# Patient Record
Sex: Male | Born: 1970 | Race: White | Hispanic: No | Smoking: Current every day smoker
Health system: Southern US, Community
[De-identification: ages and names within clinical notes are randomized; demographics above are authoritative.]

---

## 2003-08-31 HISTORY — PX: CHOLECYSTECTOMY: SHX55

## 2013-09-04 ENCOUNTER — Emergency Department: Payer: Self-pay | Admitting: Emergency Medicine

## 2013-09-04 LAB — COMPREHENSIVE METABOLIC PANEL
ALBUMIN: 4.5 g/dL (ref 3.4–5.0)
ALT: 35 U/L (ref 12–78)
ANION GAP: 4 — AB (ref 7–16)
Alkaline Phosphatase: 97 U/L
BILIRUBIN TOTAL: 0.6 mg/dL (ref 0.2–1.0)
BUN: 10 mg/dL (ref 7–18)
CHLORIDE: 104 mmol/L (ref 98–107)
Calcium, Total: 9 mg/dL (ref 8.5–10.1)
Co2: 27 mmol/L (ref 21–32)
Creatinine: 1.12 mg/dL (ref 0.60–1.30)
EGFR (African American): >60
EGFR (Non-African Amer.): >60
Glucose: 95 mg/dL (ref 65–99)
Osmolality: 269 (ref 275–301)
Potassium: 3.8 mmol/L (ref 3.5–5.1)
SGOT(AST): 25 U/L (ref 15–37)
SODIUM: 135 mmol/L — AB (ref 136–145)
Total Protein: 8.3 g/dL — ABNORMAL HIGH (ref 6.4–8.2)

## 2013-09-04 LAB — URINALYSIS, COMPLETE
Bacteria: NONE SEEN
Bilirubin,UR: NEGATIVE
Blood: NEGATIVE
Glucose,UR: NEGATIVE mg/dL (ref 0–75)
Ketone: NEGATIVE
Leukocyte Esterase: NEGATIVE
Nitrite: NEGATIVE
Ph: 7 (ref 4.5–8.0)
Protein: NEGATIVE
RBC,UR: NONE SEEN /HPF (ref 0–5)
Specific Gravity: 1.003 (ref 1.003–1.030)
WBC UR: NONE SEEN /HPF (ref 0–5)

## 2013-09-04 LAB — CBC
HCT: 46.9 % (ref 40.0–52.0)
HGB: 16.1 g/dL (ref 13.0–18.0)
MCH: 31.2 pg (ref 26.0–34.0)
MCHC: 34.4 g/dL (ref 32.0–36.0)
MCV: 91 fL (ref 80–100)
Platelet: 208 10*3/uL (ref 150–440)
RBC: 5.16 10*6/uL (ref 4.40–5.90)
RDW: 13.9 % (ref 11.5–14.5)
WBC: 7.2 10*3/uL (ref 3.8–10.6)

## 2018-04-05 ENCOUNTER — Emergency Department
Admission: EM | Admit: 2018-04-05 | Discharge: 2018-04-05 | Disposition: A | Discharge status: Home / Self Care | Payer: PRIVATE HEALTH INSURANCE | Attending: Emergency Medicine | Admitting: Emergency Medicine

## 2018-04-05 ENCOUNTER — Other Ambulatory Visit: Payer: Self-pay

## 2018-04-05 DIAGNOSIS — F17200 Nicotine dependence, unspecified, uncomplicated: Secondary | ICD-10-CM | POA: Insufficient documentation

## 2018-04-05 DIAGNOSIS — R1013 Epigastric pain: Secondary | ICD-10-CM | POA: Insufficient documentation

## 2018-04-05 DIAGNOSIS — R109 Unspecified abdominal pain: Secondary | ICD-10-CM | POA: Diagnosis present

## 2018-04-05 LAB — URINALYSIS, COMPLETE (UACMP) WITH MICROSCOPIC
Bacteria, UA: NONE SEEN
Bilirubin Urine: NEGATIVE
Glucose, UA: NEGATIVE mg/dL
Hgb urine dipstick: NEGATIVE
Ketones, ur: NEGATIVE mg/dL
Leukocytes, UA: NEGATIVE
Nitrite: NEGATIVE
Protein, ur: NEGATIVE mg/dL
Specific Gravity, Urine: 1.023 (ref 1.005–1.030)
Squamous Epithelial / LPF: NONE SEEN (ref 0–5)
pH: 5 (ref 5.0–8.0)

## 2018-04-05 LAB — LIPASE, BLOOD: Lipase: 27 U/L (ref 11–51)

## 2018-04-05 LAB — COMPREHENSIVE METABOLIC PANEL
ALT: 18 U/L (ref 0–44)
AST: 20 U/L (ref 15–41)
Albumin: 4.3 g/dL (ref 3.5–5.0)
Alkaline Phosphatase: 82 U/L (ref 38–126)
Anion gap: 7 (ref 5–15)
BUN: 12 mg/dL (ref 6–20)
CO2: 24 mmol/L (ref 22–32)
Calcium: 8.7 mg/dL — ABNORMAL LOW (ref 8.9–10.3)
Chloride: 109 mmol/L (ref 98–111)
Creatinine, Ser: 1.07 mg/dL (ref 0.61–1.24)
GFR calc Af Amer: >60 mL/min (ref >60)
GFR calc non Af Amer: >60 mL/min (ref >60)
Glucose, Bld: 135 mg/dL — ABNORMAL HIGH (ref 70–99)
Potassium: 4 mmol/L (ref 3.5–5.1)
Sodium: 140 mmol/L (ref 135–145)
Total Bilirubin: 0.6 mg/dL (ref 0.3–1.2)
Total Protein: 7.1 g/dL (ref 6.5–8.1)

## 2018-04-05 LAB — CBC
HCT: 42 % (ref 40.0–52.0)
Hemoglobin: 14.5 g/dL (ref 13.0–18.0)
MCH: 32.4 pg (ref 26.0–34.0)
MCHC: 34.4 g/dL (ref 32.0–36.0)
MCV: 94 fL (ref 80.0–100.0)
Platelets: 198 10*3/uL (ref 150–440)
RBC: 4.47 MIL/uL (ref 4.40–5.90)
RDW: 14.1 % (ref 11.5–14.5)
WBC: 8.2 10*3/uL (ref 3.8–10.6)

## 2018-04-05 MED ORDER — DICYCLOMINE HCL 10 MG PO CAPS
10.0000 mg | ORAL_CAPSULE | Freq: Once | ORAL | Status: AC
  Administered 2018-04-05: 10 mg via ORAL
  Filled 2018-04-05: qty 1

## 2018-04-05 MED ORDER — ONDANSETRON 8 MG PO TBDP: 8.0000 mg | ORAL_TABLET | Freq: Three times a day (TID) | ORAL | 0 refills | Status: AC | PRN

## 2018-04-05 MED ORDER — OMEPRAZOLE 40 MG PO CPDR: 40.0000 mg | DELAYED_RELEASE_CAPSULE | Freq: Every day | ORAL | 0 refills | Status: DC

## 2018-04-05 MED ORDER — DICYCLOMINE HCL 10 MG PO CAPS: 10.0000 mg | ORAL_CAPSULE | Freq: Three times a day (TID) | ORAL | 0 refills | Status: DC | PRN

## 2018-04-05 MED ORDER — ONDANSETRON HCL 4 MG/2ML IJ SOLN
4.0000 mg | Freq: Once | INTRAMUSCULAR | Status: AC
  Administered 2018-04-05: 4 mg via INTRAVENOUS
  Filled 2018-04-05: qty 2

## 2018-04-05 MED ORDER — FAMOTIDINE IN NACL 20-0.9 MG/50ML-% IV SOLN
20.0000 mg | Freq: Once | INTRAVENOUS | Status: AC
  Administered 2018-04-05: 20 mg via INTRAVENOUS
  Filled 2018-04-05: qty 50

## 2018-04-05 NOTE — ED Notes (Signed)
Pt presents to ED via POV with c/o abdominal cramping, N/V x 2 days. Pt states intermittent cramps that "feel like [his] gall bladder again", pt however endorses having a cholecystectomy in 2001. Pt states intermittent nausea, nothing worsens/betters the nausea, states sometimes eating helps. Pt states last BM earlier today.

## 2018-04-05 NOTE — ED Notes (Signed)
NAD noted at time of D/C. Pt denies questions or concerns. Pt ambulatory to the lobby at this time.  

## 2018-04-05 NOTE — ED Notes (Signed)
Medications administered per MD order. This RN explained that patient would be D/C upon completion of medication. Pt states understanding. Will continue to monitor for further patient needs.

## 2018-04-05 NOTE — ED Triage Notes (Signed)
Pt c/o intermittent generalized abd pain/cramping for the past 2 days with N/V.Dwayne Lozano Denies diarrhea.Dwayne Lozano

## 2018-04-05 NOTE — ED Provider Notes (Signed)
[facility] Emergency Department Provider Note ____________________________________________   First MD Initiated Contact with Patient 04/05/18 1709     (approximate)  I have reviewed the triage vital signs and the nursing notes.   HISTORY  Chief Complaint Abdominal Pain    HPI Dwayne Lozano. is a 47 y.o. male with PMH as noted below including cholecystectomy approximately 18 years ago who presents with upper abdominal pain over the last several days, intermittent, occurring a few times per day, and associated with nausea, burning in his chest, and a few episodes of vomiting.  Patient denies any change in his bowel movements, he denies fever.  He states he has had less severe bouts of this in the past.   History reviewed. No pertinent past medical history.  There are no active problems to display for this patient.   Past Surgical History:  Procedure Laterality Date  . CHOLECYSTECTOMY      Prior to Admission medications   Not on File    Allergies Patient has no known allergies.  No family history on file.  Social History Social History   Tobacco Use  . Smoking status: Current Every Day Smoker  . Smokeless tobacco: Never Used  Substance Use Topics  . Alcohol use: Not Currently  . Drug use: Not Currently    Review of Systems  Constitutional: No fever. Eyes: No redness. ENT: No sore throat. Cardiovascular: Denies chest pain. Respiratory: Denies shortness of breath. Gastrointestinal: Positive for vomiting.  Genitourinary: Negative for flank pain.  Musculoskeletal: Negative for back pain. Skin: Negative for rash. Neurological: Negative for headache.   ____________________________________________   PHYSICAL EXAM:  VITAL SIGNS: ED Triage Vitals  Enc Vitals Group     BP 04/05/18 1555 105/73     Pulse Rate 04/05/18 1555 86     Resp 04/05/18 1555 16     Temp 04/05/18 1555 98.4 F (36.9 C)     Temp Source 04/05/18 1555 Oral       SpO2 04/05/18 1555 98 %     Weight 04/05/18 1556 135 lb (61.2 kg)     Height 04/05/18 1556 5\' 6"  (1.676 m)     Head Circumference --      Peak Flow --      Pain Score 04/05/18 1556 3     Pain Loc --      Pain Edu? --      Excl. in [place]? --     Constitutional: Alert and oriented.  Relatively well appearing and in no acute distress. Eyes: Conjunctivae are normal.  No scleral icterus. Head: Atraumatic. Nose: No congestion/rhinnorhea. Mouth/Throat: Mucous membranes are moist.   Neck: Normal range of motion.  Cardiovascular: Normal rate, regular rhythm. Grossly normal heart sounds.  Good peripheral circulation. Respiratory: Normal respiratory effort.  No retractions. Lungs CTAB. Gastrointestinal: Soft and nontender. No distention.  Genitourinary: No flank tenderness. Musculoskeletal: Extremities warm and well perfused.  Neurologic:  Normal speech and language. No gross focal neurologic deficits are appreciated.  Skin:  Skin is warm and dry. No rash noted. Psychiatric: Mood and affect are normal. Speech and behavior are normal.  ____________________________________________   LABS (all labs ordered are listed, but only abnormal results are displayed)  Labs Reviewed  COMPREHENSIVE METABOLIC PANEL - Abnormal; Notable for the following components:      Result Value   Glucose, Bld 135 (*)    Calcium 8.7 (*)    All other components within normal limits  URINALYSIS, COMPLETE (  UACMP) WITH MICROSCOPIC - Abnormal; Notable for the following components:   Color, Urine YELLOW (*)    APPearance CLEAR (*)    All other components within normal limits  LIPASE, BLOOD  CBC   ____________________________________________  EKG   ____________________________________________  RADIOLOGY    ____________________________________________   PROCEDURES  Procedure(s) performed: No  Procedures  Critical Care performed: No ____________________________________________   INITIAL  IMPRESSION / ASSESSMENT AND PLAN / ED COURSE  Pertinent labs & imaging results that were available during my care of the patient were reviewed by me and considered in my medical decision making (see chart for details).  47 year old male with PMH as noted above and status post cholecystectomy presents with intermittent upper abdominal pain over the last several days associated with nausea and burning in his chest.  It is sometimes improved with eating.  No change in his bowel movements.  On exam, the patient is relatively well-appearing, his vital signs are normal, and the abdomen is soft with no focal tenderness.  He states that his symptoms have improved at this time.  Overall primary differential would be GERD versus gastritis.  Initial labs obtained from triage show no acute abnormalities, so I do not suspect pancreatitis or other hepatobiliary cause.  Given the negative work-up and resolved symptoms, the patient is stable for discharge home.  I counseled the patient on the results of the work-up and the likely diagnoses.  I will prescribe a PPI, and antispasmodic, and nausea medicine for home.  I will also give a referral to GI.  The patient agrees with this plan.  Return precautions given, and he expresses understanding.  ____________________________________________   FINAL CLINICAL IMPRESSION(S) / ED DIAGNOSES  Final diagnoses:  Epigastric pain      NEW MEDICATIONS STARTED DURING THIS VISIT:  New Prescriptions   No medications on file     Note:  This document was prepared using Dragon voice recognition software and may include unintentional dictation errors.    Arta Silence, MD 04/05/18 [phone]

## 2018-04-05 NOTE — Discharge Instructions (Addendum)
Take the acid blocking medication (omeprazole) as prescribed daily for the next several weeks, and the other occasions as needed for the symptoms.  Return to the ER for new, worsening, persistent severe pain, vomiting, fever, or any other new or worsening symptoms that concern you.  We have provided a referral to a gastroenterologist.

## 2018-06-13 ENCOUNTER — Encounter (INDEPENDENT_AMBULATORY_CARE_PROVIDER_SITE_OTHER): Payer: Self-pay

## 2018-06-13 ENCOUNTER — Other Ambulatory Visit: Payer: Self-pay

## 2018-06-13 ENCOUNTER — Encounter: Payer: Self-pay | Admitting: Gastroenterology

## 2018-06-13 ENCOUNTER — Ambulatory Visit (INDEPENDENT_AMBULATORY_CARE_PROVIDER_SITE_OTHER): Payer: PRIVATE HEALTH INSURANCE | Admitting: Gastroenterology

## 2018-06-13 VITALS — BP 111/66 | HR 71 | Ht 66.5 in | Wt 123.8 lb

## 2018-06-13 DIAGNOSIS — R109 Unspecified abdominal pain: Secondary | ICD-10-CM | POA: Diagnosis not present

## 2018-06-13 DIAGNOSIS — Z1211 Encounter for screening for malignant neoplasm of colon: Secondary | ICD-10-CM | POA: Diagnosis not present

## 2018-06-13 DIAGNOSIS — K219 Gastro-esophageal reflux disease without esophagitis: Secondary | ICD-10-CM

## 2018-06-13 DIAGNOSIS — K5900 Constipation, unspecified: Secondary | ICD-10-CM | POA: Diagnosis not present

## 2018-06-13 NOTE — Progress Notes (Signed)
Dwayne Lozano [address]  [place], [place] [address]  Main: [phone]  Fax: [phone]   Gastroenterology Consultation  Referring Provider:     Dr. Cherylann Banas Primary Care Physician:  Patient, No Pcp Per Primary Gastroenterologist:  Dr. Vonda Lozano Reason for Consultation:     Abdominal pain        HPI:    Chief Complaint  Patient presents with  . New Patient (Initial Visit)    ED f/u epigastric pain, abdominal pain (left lower side).    Dwayne Lozano. is a 47 y.o. y/o male referred for consultation & management  by Dr. Patient, No Pcp Per.  Patient went to the ER in August 2019 due to midepigastric abdominal pain and heartburn.  He has been prescribed omeprazole after that visit and his midepigastric pain has improved.  He was having symptoms 3-4 times a week of burning sensation in his chest and midepigastric pain, and is now only having 1-2 times a week.  No dysphagia.  Patient is otherwise a thin male, but denies any weight loss.  Reports eating regular meals without skipping.  No nausea or vomiting.  No prior EGD or colonoscopy.  No family history of colon cancer.  Also reports intermittent left lower quadrant abdominal pain intermittently, 3-4 times a week that improves after bowel movements, cramping, 1/10 in intensity, present since 2005.  Reports 1-2 soft bowel movements daily without straining.  Denies any diarrhea or constipation.  No hematochezia or melena.  Past medical history: Cholecystectomy  Past Surgical History:  Procedure Laterality Date  . CHOLECYSTECTOMY  2005    Prior to Admission medications   Not on File    Family History  Problem Relation Age of Onset  . Cancer Mother        cancer in back?, breast cancer  . Diabetes Maternal Grandmother   . Kidney disease Maternal Grandmother   . Cancer Maternal Aunt        breast cancer, gum cancer     Social History   Tobacco Use  . Smoking status: Current Every Day  Smoker  . Smokeless tobacco: Never Used  Substance Use Topics  . Alcohol use: Not Currently  . Drug use: Not Currently    Allergies as of 06/13/2018  . (No Known Allergies)    Review of Systems:    All systems reviewed and negative except where noted in HPI.   Physical Exam:  BP 111/66   Pulse 71   Ht 5' 6.5" (1.689 m)   Wt 123 lb 12.8 oz (56.2 kg)   BMI 19.68 kg/m  No LMP for male patient. Psych:  Alert and cooperative. Normal mood and affect. General:   Alert,  Well-developed, well-nourished, pleasant and cooperative in NAD Head:  Normocephalic and atraumatic. Eyes:  Sclera clear, no icterus.   Conjunctiva pink. Ears:  Normal auditory acuity. Nose:  No deformity, discharge, or lesions. Mouth:  No deformity or lesions,oropharynx pink & moist. Neck:  Supple; no masses or thyromegaly. Abdomen:  Normal bowel sounds.  No bruits.  Soft, non-tender and non-distended without masses, hepatosplenomegaly or hernias noted.  No guarding or rebound tenderness.    Msk:  Symmetrical without gross deformities. Good, equal movement & strength bilaterally. Pulses:  Normal pulses noted. Extremities:  No clubbing or edema.  No cyanosis. Neurologic:  Alert and oriented x3;  grossly normal neurologically. Skin:  Intact without significant lesions or rashes. No jaundice. Lymph Nodes:  No significant  cervical adenopathy. Psych:  Alert and cooperative. Normal mood and affect.   Labs: CBC    Component Value Date/Time   WBC 8.2 04/05/2018 1616   RBC 4.47 04/05/2018 1616   HGB 14.5 04/05/2018 1616   HGB 16.1 09/04/2013 2039   HCT 42.0 04/05/2018 1616   HCT 46.9 09/04/2013 2039   PLT 198 04/05/2018 1616   PLT 208 09/04/2013 2039   MCV 94.0 04/05/2018 1616   MCV 91 09/04/2013 2039   MCH 32.4 04/05/2018 1616   MCHC 34.4 04/05/2018 1616   RDW 14.1 04/05/2018 1616   RDW 13.9 09/04/2013 2039   CMP     Component Value Date/Time   NA 140 04/05/2018 1616   NA 135 (L) 09/04/2013 2039   K  4.0 04/05/2018 1616   K 3.8 09/04/2013 2039   CL 109 04/05/2018 1616   CL 104 09/04/2013 2039   CO2 24 04/05/2018 1616   CO2 27 09/04/2013 2039   GLUCOSE 135 (H) 04/05/2018 1616   GLUCOSE 95 09/04/2013 2039   BUN 12 04/05/2018 1616   BUN 10 09/04/2013 2039   CREATININE 1.07 04/05/2018 1616   CREATININE 1.12 09/04/2013 2039   CALCIUM 8.7 (L) 04/05/2018 1616   CALCIUM 9.0 09/04/2013 2039   PROT 7.1 04/05/2018 1616   PROT 8.3 (H) 09/04/2013 2039   ALBUMIN 4.3 04/05/2018 1616   ALBUMIN 4.5 09/04/2013 2039   AST 20 04/05/2018 1616   AST 25 09/04/2013 2039   ALT 18 04/05/2018 1616   ALT 35 09/04/2013 2039   ALKPHOS 82 04/05/2018 1616   ALKPHOS 97 09/04/2013 2039   BILITOT 0.6 04/05/2018 1616   BILITOT 0.6 09/04/2013 2039   GFRNONAA >60 04/05/2018 1616   GFRNONAA >60 09/04/2013 2039   GFRAA >60 04/05/2018 1616   GFRAA >60 09/04/2013 2039    Imaging Studies: CT abdomen in 2015, with large amount of stool in rectosigmoid  Assessment and Plan:   Daray Polgar. is a 47 y.o. y/o male has been referred for abdominal pain  Midepigastric abdominal pain and heartburn that he went to the ER with has improved with omeprazole once daily We discussed discontinuing the medication and changing it to Pepcid but patient does not want symptoms to return However, he is willing to undergo an EGD and if that does not show any alarming findings, he is willing to try to discontinue medication or try Pepcid.  (Risks of PPI use were discussed with patient including bone loss, C. Diff diarrhea, pneumonia, infections, CKD, electrolyte abnormalities.  If clinically possible based on symptoms, goal would be to maintain patient on the lowest dose possible, or discontinue the medication with institution of acid reflux lifestyle modifications over time. Pt. Verbalizes understanding and chooses to continue the medication.)  Patient educated extensively on acid reflux lifestyle modification, including buying a  bed wedge, not eating 3 hrs before bedtime, diet modifications, and handout given for the same.    His left lower quadrant abdominal pain is chronic, and it appears that there was CT done in 2015 for the same reason which showed constipation. His pain is likely related to underlying constipation as well Labs are reassuring No diarrhea, hematochezia, melena presents No alarm symptoms present High-fiber diet MiraLAX or Metamucil daily with goal of 1-2 soft bowel movements daily.  If not at goal, patient instructed to increase dose to twice daily.  If loose stools with the medication, patient asked to decrease the medication to every other day, or half  dose daily.  Patient verbalized understanding  He is due for a screening colonoscopy as per American Cancer Society guidelines and this was discussed He would like to proceed with colonoscopy along with EGD Colonoscopy would also allow [place] to evaluate for any underlying diverticulosis which would be a manifestation of constipation Patient is agreeable to proceeding with colonoscopy at this time  Patient was also asked to establish primary care as soon as possible and he verbalized understanding    Dr Dwayne Lozano  Speech recognition software was used to dictate the above note.

## 2018-06-15 NOTE — Addendum Note (Signed)
Addended by: Earl Lagos on: 06/15/2018 12:10 PM   Modules accepted: Orders, SmartSet

## 2018-07-06 ENCOUNTER — Encounter
Admission: RE | Disposition: A | Discharge status: Home / Self Care | Payer: Self-pay | Source: Ambulatory Visit | Attending: Gastroenterology

## 2018-07-06 ENCOUNTER — Ambulatory Visit
Admission: RE | Admit: 2018-07-06 | Discharge: 2018-07-06 | Disposition: A | Discharge status: Home / Self Care | Payer: Self-pay | Source: Ambulatory Visit | Attending: Gastroenterology | Admitting: Gastroenterology

## 2018-07-06 ENCOUNTER — Ambulatory Visit: Payer: Self-pay | Admitting: Anesthesiology

## 2018-07-06 ENCOUNTER — Encounter: Payer: Self-pay | Admitting: *Deleted

## 2018-07-06 DIAGNOSIS — D125 Benign neoplasm of sigmoid colon: Secondary | ICD-10-CM | POA: Insufficient documentation

## 2018-07-06 DIAGNOSIS — F17200 Nicotine dependence, unspecified, uncomplicated: Secondary | ICD-10-CM | POA: Insufficient documentation

## 2018-07-06 DIAGNOSIS — K3189 Other diseases of stomach and duodenum: Secondary | ICD-10-CM

## 2018-07-06 DIAGNOSIS — K621 Rectal polyp: Secondary | ICD-10-CM

## 2018-07-06 DIAGNOSIS — R109 Unspecified abdominal pain: Secondary | ICD-10-CM

## 2018-07-06 DIAGNOSIS — R1013 Epigastric pain: Secondary | ICD-10-CM

## 2018-07-06 DIAGNOSIS — Z1211 Encounter for screening for malignant neoplasm of colon: Secondary | ICD-10-CM

## 2018-07-06 DIAGNOSIS — K635 Polyp of colon: Secondary | ICD-10-CM

## 2018-07-06 HISTORY — PX: COLONOSCOPY WITH PROPOFOL: SHX5780

## 2018-07-06 HISTORY — PX: ESOPHAGOGASTRODUODENOSCOPY (EGD) WITH PROPOFOL: SHX5813

## 2018-07-06 SURGERY — COLONOSCOPY WITH PROPOFOL
Anesthesia: General

## 2018-07-06 MED ORDER — PROPOFOL 10 MG/ML IV BOLUS
INTRAVENOUS | Status: AC
  Filled 2018-07-06: qty 20

## 2018-07-06 MED ORDER — LIDOCAINE HCL (PF) 2 % IJ SOLN
INTRAMUSCULAR | Status: AC
  Filled 2018-07-06: qty 10

## 2018-07-06 MED ORDER — LIDOCAINE HCL (CARDIAC) PF 100 MG/5ML IV SOSY
PREFILLED_SYRINGE | INTRAVENOUS | Status: DC | PRN
  Administered 2018-07-06: 30 mg via INTRAVENOUS

## 2018-07-06 MED ORDER — PROPOFOL 500 MG/50ML IV EMUL
INTRAVENOUS | Status: DC | PRN
  Administered 2018-07-06: 120 ug/kg/min via INTRAVENOUS

## 2018-07-06 MED ORDER — SODIUM CHLORIDE 0.9 % IV SOLN
INTRAVENOUS | Status: DC
  Administered 2018-07-06: 08:00:00 via INTRAVENOUS

## 2018-07-06 MED ORDER — EPHEDRINE SULFATE 50 MG/ML IJ SOLN
INTRAMUSCULAR | Status: AC
  Filled 2018-07-06: qty 1

## 2018-07-06 MED ORDER — FENTANYL CITRATE (PF) 100 MCG/2ML IJ SOLN
INTRAMUSCULAR | Status: AC
  Filled 2018-07-06: qty 2

## 2018-07-06 MED ORDER — MIDAZOLAM HCL 2 MG/2ML IJ SOLN
INTRAMUSCULAR | Status: AC
  Filled 2018-07-06: qty 2

## 2018-07-06 MED ORDER — MIDAZOLAM HCL 2 MG/2ML IJ SOLN
INTRAMUSCULAR | Status: DC | PRN
  Administered 2018-07-06: 2 mg via INTRAVENOUS

## 2018-07-06 MED ORDER — EPHEDRINE SULFATE 50 MG/ML IJ SOLN
INTRAMUSCULAR | Status: DC | PRN
  Administered 2018-07-06: 5 mg via INTRAVENOUS

## 2018-07-06 MED ORDER — PROPOFOL 500 MG/50ML IV EMUL
INTRAVENOUS | Status: AC
  Filled 2018-07-06: qty 50

## 2018-07-06 MED ORDER — FENTANYL CITRATE (PF) 100 MCG/2ML IJ SOLN
INTRAMUSCULAR | Status: DC | PRN
  Administered 2018-07-06: 50 ug via INTRAVENOUS

## 2018-07-06 NOTE — Anesthesia Preprocedure Evaluation (Addendum)
Anesthesia Evaluation  Patient identified by MRN, date of birth, ID band Patient awake    Reviewed: Allergy & Precautions, H&P , NPO status , Patient's Chart, lab work & pertinent test results  Airway Mallampati: II       Dental  (+) Edentulous Lower, Edentulous Upper   Pulmonary neg pulmonary ROS, Current Smoker,           Cardiovascular (-) angina(-) Past MI negative cardio ROS   - Diastolic murmurs    Neuro/Psych negative neurological ROS  negative psych ROS   GI/Hepatic negative GI ROS, Neg liver ROS,   Endo/Other  negative endocrine ROS  Renal/GU negative Renal ROS  negative genitourinary   Musculoskeletal   Abdominal   Peds  Hematology negative hematology ROS (+)   Anesthesia Other Findings History reviewed. No pertinent past medical history.  Past Surgical History: 2005: CHOLECYSTECTOMY  BMI    Body Mass Index:  19.68 kg/m      Reproductive/Obstetrics negative OB ROS                            Anesthesia Physical Anesthesia Plan  ASA: II  Anesthesia Plan: General   Post-op Pain Management:    Induction:   PONV Risk Score and Plan: Propofol infusion and TIVA  Airway Management Planned: Natural Airway and Nasal Cannula  Additional Equipment:   Intra-op Plan:   Post-operative Plan:   Informed Consent: I have reviewed the patients History and Physical, chart, labs and discussed the procedure including the risks, benefits and alternatives for the proposed anesthesia with the patient or authorized representative who has indicated his/her understanding and acceptance.   Dental Advisory Given  Plan Discussed with: Anesthesiologist, CRNA and Surgeon  Anesthesia Plan Comments:        Anesthesia Quick Evaluation

## 2018-07-06 NOTE — Anesthesia Postprocedure Evaluation (Signed)
Anesthesia Post Note  Patient: Dwayne Lozano.  Procedure(s) Performed: COLONOSCOPY WITH PROPOFOL (N/A ) ESOPHAGOGASTRODUODENOSCOPY (EGD) WITH PROPOFOL (N/A )  Patient location during evaluation: PACU Anesthesia Type: General Level of consciousness: awake and alert Pain management: pain level controlled Vital Signs Assessment: post-procedure vital signs reviewed and stable Respiratory status: spontaneous breathing, nonlabored ventilation and respiratory function stable Cardiovascular status: blood pressure returned to baseline and stable Postop Assessment: no apparent nausea or vomiting Anesthetic complications: no     Last Vitals:  Vitals:   07/06/18 0928 07/06/18 0938  BP: 127/83 113/86  Pulse: 66 60  Resp: 12 11  Temp:    SpO2: 99% 100%    Last Pain:  Vitals:   07/06/18 0938  TempSrc:   PainSc: 0-No pain                 Durenda Hurt

## 2018-07-06 NOTE — Op Note (Signed)
[facility] Gastroenterology Patient Name: Affan Callow Procedure Date: 07/06/2018 7:21 AM MRN: [id number] Account #: [id number] Date of Birth: 1971/03/17 Admit Type: Outpatient Age: 47 Room: [facility] ENDO ROOM 2 Gender: Male Note Status: Finalized Procedure:            Colonoscopy Indications:          Screening for colorectal malignant neoplasm Providers:            Khyler Urda B. Bonna Gains MD, MD Medicines:            Monitored Anesthesia Care Complications:        No immediate complications. Procedure:            Pre-Anesthesia Assessment:                       - ASA Grade Assessment: II - A patient with mild                        systemic disease.                       - Prior to the procedure, a History and Physical was                        performed, and patient medications, allergies and                        sensitivities were reviewed. The patient's tolerance of                        previous anesthesia was reviewed.                       - The risks and benefits of the procedure and the                        sedation options and risks were discussed with the                        patient. All questions were answered and informed                        consent was obtained.                       - Patient identification and proposed procedure were                        verified prior to the procedure by the physician, the                        nurse, the anesthesiologist, the anesthetist and the                        technician. The procedure was verified in the procedure                        room.                       After obtaining informed consent, the colonoscope was  passed under direct vision. Throughout the procedure,                        the patient's blood pressure, pulse, and oxygen                        saturations were monitored continuously. The                        Colonoscope was introduced through the anus  and                        advanced to the the cecum, identified by appendiceal                        orifice and ileocecal valve. The colonoscopy was                        performed with ease. The patient tolerated the                        procedure well. The quality of the bowel preparation                        was fair. Water and suctioning was used to clean the                        colon. Findings:      The perianal and digital rectal examinations were normal.      Two sessile polyps were found in the sigmoid colon. The polyps were 4 to       6 mm in size. These polyps were removed with a cold snare. Resection and       retrieval were complete.      A 3 mm polyp was found in the rectum. The polyp was sessile. The polyp       was removed with a cold biopsy forceps. Resection and retrieval were       complete.      The exam was otherwise without abnormality.      The rectum, sigmoid colon, descending colon, transverse colon, ascending       colon and cecum appeared normal.      The retroflexed view of the distal rectum and anal verge was normal and       showed no anal or rectal abnormalities. Impression:           - Preparation of the colon was fair.                       - Two 4 to 6 mm polyps in the sigmoid colon, removed                        with a cold snare. Resected and retrieved.                       - One 3 mm polyp in the rectum, removed with a cold                        biopsy forceps. Resected and retrieved.                       -  The examination was otherwise normal.                       - The rectum, sigmoid colon, descending colon,                        transverse colon, ascending colon and cecum are normal.                       - The distal rectum and anal verge are normal on                        retroflexion view. Recommendation:       - Discharge patient to home (with escort).                       - Advance diet as tolerated.                        - Continue present medications.                       - Await pathology results.                       - Repeat colonoscopy in 3 years for surveillance (with                        2 day prep).                       - The findings and recommendations were discussed with                        the patient.                       - The findings and recommendations were discussed with                        the patient's family.                       - Return to primary care physician as previously                        scheduled. Procedure Code(s):    --- Professional ---                       [phone], Colonoscopy, flexible; with removal of tumor(s),                        polyp(s), or other lesion(s) by snare technique                       45380, 51, Colonoscopy, flexible; with biopsy, single                        or multiple Diagnosis Code(s):    --- Professional ---                       Z12.11, Encounter for screening for malignant neoplasm  of colon                       D12.5, Benign neoplasm of sigmoid colon                       K62.1, Rectal polyp CPT copyright 2018 American Medical Association. All rights reserved. The codes documented in this report are preliminary and upon coder review may  be revised to meet current compliance requirements.  Vonda Antigua, MD Margretta Sidle B. Bonna Gains MD, MD 07/06/2018 9:09:40 AM This report has been signed electronically. Number of Addenda: 0 Note Initiated On: 07/06/2018 7:21 AM Scope Withdrawal Time: 0 hours 27 minutes 19 seconds  Total Procedure Duration: 0 hours 34 minutes 10 seconds  Estimated Blood Loss: Estimated blood loss: none.      [facility]

## 2018-07-06 NOTE — Anesthesia Procedure Notes (Signed)
Performed by: Vaughan Sine Pre-anesthesia Checklist: Patient identified, Suction available, Emergency Drugs available, Patient being monitored and Timeout performed Patient Re-evaluated:Patient Re-evaluated prior to induction Oxygen Delivery Method: Nasal cannula Preoxygenation: Pre-oxygenation with 100% oxygen Induction Type: IV induction Airway Equipment and Method: Bite block Placement Confirmation: positive ETCO2 and CO2 detector

## 2018-07-06 NOTE — H&P (Signed)
Dwayne Antigua, MD [address] [place], [place], [place], [address] [id number], [place], [place], [place], [address] Phone: [phone]  Fax: [phone]  Primary Care Physician:  Patient, No Pcp Per   Pre-Procedure History & Physical: HPI:  Dwayne Lozano. is a 47 y.o. male is here for a colonoscopy and EGD.   History reviewed. No pertinent past medical history.  Past Surgical History:  Procedure Laterality Date  . CHOLECYSTECTOMY  2005    Prior to Admission medications   Not on File    Allergies as of 06/15/2018  . (No Known Allergies)    Family History  Problem Relation Age of Onset  . Cancer Mother        cancer in back?, breast cancer  . Diabetes Maternal Grandmother   . Kidney disease Maternal Grandmother   . Cancer Maternal Aunt        breast cancer, gum cancer    Social History   Socioeconomic History  . Marital status: Divorced    Spouse name: Not on file  . Number of children: Not on file  . Years of education: Not on file  . Highest education level: Not on file  Occupational History  . Not on file  Social Needs  . Financial resource strain: Not on file  . Food insecurity:    Worry: Not on file    Inability: Not on file  . Transportation needs:    Medical: Not on file    Non-medical: Not on file  Tobacco Use  . Smoking status: Current Every Day Smoker  . Smokeless tobacco: Never Used  Substance and Sexual Activity  . Alcohol use: Not Currently  . Drug use: Not Currently  . Sexual activity: Not on file  Lifestyle  . Physical activity:    Days per week: Not on file    Minutes per session: Not on file  . Stress: Not on file  Relationships  . Social connections:    Talks on phone: Not on file    Gets together: Not on file    Attends religious service: Not on file    Active member of club or organization: Not on file    Attends meetings of clubs or organizations: Not on file    Relationship status: Not on file  . Intimate  partner violence:    Fear of current or ex partner: Not on file    Emotionally abused: Not on file    Physically abused: Not on file    Forced sexual activity: Not on file  Other Topics Concern  . Not on file  Social History Narrative  . Not on file    Review of Systems: See HPI, otherwise negative ROS  Physical Exam: BP 131/74   Pulse 68   Temp 97.7 F (36.5 C) (Tympanic)   Resp 16   Wt 56.2 kg   SpO2 100%   BMI 19.68 kg/m  General:   Alert,  pleasant and cooperative in NAD Head:  Normocephalic and atraumatic. Neck:  Supple; no masses or thyromegaly. Lungs:  Clear throughout to auscultation, normal respiratory effort.    Heart:  +S1, +S2, Regular rate and rhythm, No edema. Abdomen:  Soft, nontender and nondistended. Normal bowel sounds, without guarding, and without rebound.   Neurologic:  Alert and  oriented x4;  grossly normal neurologically.  Impression/Plan: Dwayne Lozano. is here for a colonoscopy to be performed for average risk screening and EGD for Acid Reflux, abdominal pain.  Risks, benefits, limitations, and alternatives regarding the procedures have been reviewed with the patient.  Questions have been answered.  All parties agreeable.   Virgel Manifold, MD  07/06/2018, 8:08 AM

## 2018-07-06 NOTE — Op Note (Signed)
[facility] Gastroenterology Patient Name: Dwayne Lozano Procedure Date: 07/06/2018 7:23 AM MRN: [id number] Account #: [id number] Date of Birth: 28-Feb-1971 Admit Type: Outpatient Age: 47 Room: [facility] ENDO ROOM 2 Gender: Male Note Status: Finalized Procedure:            Upper GI endoscopy Indications:          Epigastric abdominal pain Providers:            Coree Riester B. Bonna Gains MD, MD Medicines:            Monitored Anesthesia Care Complications:        No immediate complications. Procedure:            Pre-Anesthesia Assessment:                       - Prior to the procedure, a History and Physical was                        performed, and patient medications, allergies and                        sensitivities were reviewed. The patient's tolerance of                        previous anesthesia was reviewed.                       - The risks and benefits of the procedure and the                        sedation options and risks were discussed with the                        patient. All questions were answered and informed                        consent was obtained.                       - Patient identification and proposed procedure were                        verified prior to the procedure by the physician, the                        nurse, the anesthesiologist, the anesthetist and the                        technician. The procedure was verified in the procedure                        room.                       - ASA Grade Assessment: II - A patient with mild                        systemic disease.                       After obtaining informed consent, the endoscope was  passed under direct vision. Throughout the procedure,                        the patient's blood pressure, pulse, and oxygen                        saturations were monitored continuously. The Endoscope                        was introduced through the mouth, and advanced  to the                        second part of duodenum. The upper GI endoscopy was                        accomplished with ease. The patient tolerated the                        procedure well. Findings:      The Z-line was irregular. As per guidelines salmon colored mucosa above       1 cm should be biopsied. This was      not the case here, and the Z-line was just irregular and salmon colored       mucosa was      not above 1 cm in length.      The examined esophagus was normal.      Patchy mildly erythematous mucosa without bleeding was found in the       gastric antrum. Biopsies were taken with a cold forceps for histology.       Biopsies were obtained in the gastric body, at the incisura and in the       gastric antrum with cold forceps for histology.      A few, 2 to 4 mm non-bleeding erosions were found in the gastric fundus       and in the gastric antrum. There were no stigmata of recent bleeding.       Biopsies were taken with a cold forceps for histology.      The duodenal bulb, second portion of the duodenum and examined duodenum       were normal. Impression:           - Z-line irregular.                       - Normal esophagus.                       - Erythematous mucosa in the antrum. Biopsied.                       - Non-bleeding erosive gastropathy. Biopsied.                       - Normal duodenal bulb, second portion of the duodenum                        and examined duodenum.                       - Biopsies were obtained in the gastric body, at the  incisura and in the gastric antrum. Recommendation:       - Await pathology results.                       - Discharge patient to home (with escort).                       - Advance diet as tolerated.                       - Continue present medications.                       - Patient has a contact number available for                        emergencies. The signs and symptoms of potential                         delayed complications were discussed with the patient.                        Return to normal activities tomorrow. Written discharge                        instructions were provided to the patient.                       - Discharge patient to home (with escort).                       - The findings and recommendations were discussed with                        the patient.                       - The findings and recommendations were discussed with                        the patient's family.                       - Avoid NSAIDs except Aspirin if medically indicated Procedure Code(s):    --- Professional ---                       [id number], Esophagogastroduodenoscopy, flexible, transoral;                        with biopsy, single or multiple Diagnosis Code(s):    --- Professional ---                       K22.8, Other specified diseases of esophagus                       K31.89, Other diseases of stomach and duodenum                       R10.13, Epigastric pain CPT copyright 2018 American Medical Association. All rights reserved. The codes documented in this report are preliminary and upon coder review may  be revised to meet current compliance  requirements.  Vonda Antigua, MD Margretta Sidle B. Bonna Gains MD, MD 07/06/2018 8:25:09 AM This report has been signed electronically. Number of Addenda: 0 Note Initiated On: 07/06/2018 7:23 AM Estimated Blood Loss: Estimated blood loss: none.      [facility]

## 2018-07-06 NOTE — Transfer of Care (Signed)
Immediate Anesthesia Transfer of Care Note  Patient: Dwayne Lozano.  Procedure(s) Performed: COLONOSCOPY WITH PROPOFOL (N/A ) ESOPHAGOGASTRODUODENOSCOPY (EGD) WITH PROPOFOL (N/A )  Patient Location: PACU  Anesthesia Type:General  Level of Consciousness: awake and sedated  Airway & Oxygen Therapy: Patient Spontanous Breathing and Patient connected to nasal cannula oxygen  Post-op Assessment: Report given to RN and Post -op Vital signs reviewed and stable  Post vital signs: Reviewed and stable  Last Vitals:  Vitals Value Taken Time  BP    Temp    Pulse    Resp    SpO2      Last Pain:  Vitals:   07/06/18 0730  TempSrc: Tympanic         Complications: No apparent anesthesia complications

## 2018-07-06 NOTE — Anesthesia Post-op Follow-up Note (Signed)
Anesthesia QCDR form completed.        

## 2018-07-07 LAB — SURGICAL PATHOLOGY

## 2018-07-10 ENCOUNTER — Encounter: Payer: Self-pay | Admitting: Gastroenterology

## 2018-07-20 ENCOUNTER — Encounter: Payer: Self-pay | Admitting: Gastroenterology

## 2018-07-24 ENCOUNTER — Telehealth: Payer: Self-pay

## 2018-07-24 NOTE — Telephone Encounter (Signed)
LMTCO.

## 2018-07-24 NOTE — Telephone Encounter (Signed)
-----   Message from Virgel Manifold, MD sent at 07/19/2018  1:15 PM EST ----- Jackelyn Poling please let patient know, his polyps were benign but precancerous.  Repeat colonoscopy recommended in 3 years please set recall. Please let him know his stomach biopsies did not show any bacteria.  Mild changes were present in the tissue of his stomach that were sent to the second pathologist for review and his review did not reveal any signs of concern. Due to the small possibility of these changes, called metaplasia, I would recommend repeat EGD in 1 year for repeat biopsies.  This is not something that would be symptomatic. Since there were no ulcers present I would recommend that he discontinue his PPI.  If he still having symptoms I would recommend him taking Pepcid once daily instead of the PPI. He should establish a primary care provider and can follow-up with [place] in clinic with any questions.

## 2018-07-25 NOTE — Telephone Encounter (Signed)
PT LEFT VM TO SPEAK WITH DEBBIE RETURNING HER CALL FROM YESTERDAY

## 2018-08-02 ENCOUNTER — Telehealth: Payer: Self-pay | Admitting: Gastroenterology

## 2018-08-02 NOTE — Telephone Encounter (Signed)
Pt says he missed a call from [place]. Waiting for results for 3 weeks. Pls call patient

## 2018-08-02 NOTE — Telephone Encounter (Signed)
I spoke with pt today, see telephone encounter.

## 2018-08-02 NOTE — Telephone Encounter (Signed)
Pt informed of results and that recall letters will be sent for 1 yr repeat EGD and 3 yr colonoscopy. Pt not taking any PPI and I will mail pt letter with name of Pepcid per pt request. Also pt c/o sore throat and coughing at times (dry) since EGD. No fever, no spitting of blood, able to eat and drink with no problem. Possible post nasal drip or staying irritated due to smoking pt has not taken any tylenol for pain. I informed pt that if this does continue or worsens to contact office or go to an Urgent care or ED. Has not gotten a  PCP yet due to he has not received insurance card.

## 2019-07-12 ENCOUNTER — Other Ambulatory Visit: Payer: Self-pay

## 2019-07-12 ENCOUNTER — Emergency Department: Payer: Medicaid Other

## 2019-07-12 ENCOUNTER — Emergency Department
Admission: EM | Admit: 2019-07-12 | Discharge: 2019-07-12 | Disposition: A | Discharge status: Home / Self Care | Payer: Medicaid Other | Attending: Emergency Medicine | Admitting: Emergency Medicine

## 2019-07-12 ENCOUNTER — Encounter: Payer: Self-pay | Admitting: Emergency Medicine

## 2019-07-12 DIAGNOSIS — S82092B Other fracture of left patella, initial encounter for open fracture type I or II: Secondary | ICD-10-CM | POA: Insufficient documentation

## 2019-07-12 DIAGNOSIS — S81012A Laceration without foreign body, left knee, initial encounter: Secondary | ICD-10-CM

## 2019-07-12 DIAGNOSIS — Y939 Activity, unspecified: Secondary | ICD-10-CM | POA: Insufficient documentation

## 2019-07-12 DIAGNOSIS — F17200 Nicotine dependence, unspecified, uncomplicated: Secondary | ICD-10-CM | POA: Insufficient documentation

## 2019-07-12 DIAGNOSIS — Y999 Unspecified external cause status: Secondary | ICD-10-CM | POA: Insufficient documentation

## 2019-07-12 DIAGNOSIS — W228XXA Striking against or struck by other objects, initial encounter: Secondary | ICD-10-CM | POA: Insufficient documentation

## 2019-07-12 DIAGNOSIS — Z23 Encounter for immunization: Secondary | ICD-10-CM | POA: Insufficient documentation

## 2019-07-12 DIAGNOSIS — Y929 Unspecified place or not applicable: Secondary | ICD-10-CM | POA: Insufficient documentation

## 2019-07-12 MED ORDER — SULFAMETHOXAZOLE-TRIMETHOPRIM 800-160 MG PO TABS: 1.0000 | ORAL_TABLET | Freq: Two times a day (BID) | ORAL | 0 refills | Status: DC

## 2019-07-12 MED ORDER — TRAMADOL HCL 50 MG PO TABS: 50.0000 mg | ORAL_TABLET | Freq: Once | ORAL | Status: DC

## 2019-07-12 MED ORDER — TETANUS-DIPHTH-ACELL PERTUSSIS 5-2.5-18.5 LF-MCG/0.5 IM SUSP
0.5000 mL | Freq: Once | INTRAMUSCULAR | Status: DC
  Filled 2019-07-12: qty 0.5

## 2019-07-12 MED ORDER — NAPROXEN 500 MG PO TABS
500.0000 mg | ORAL_TABLET | Freq: Once | ORAL | Status: AC
  Administered 2019-07-12: 500 mg via ORAL
  Filled 2019-07-12: qty 1

## 2019-07-12 MED ORDER — METOCLOPRAMIDE HCL 10 MG PO TABS: 10.0000 mg | ORAL_TABLET | Freq: Once | ORAL | Status: DC

## 2019-07-12 MED ORDER — TETANUS-DIPHTH-ACELL PERTUSSIS 5-2.5-18.5 LF-MCG/0.5 IM SUSP
0.5000 mL | Freq: Once | INTRAMUSCULAR | Status: AC
  Administered 2019-07-12: 0.5 mL via INTRAMUSCULAR

## 2019-07-12 MED ORDER — SULFAMETHOXAZOLE-TRIMETHOPRIM 800-160 MG PO TABS
1.0000 | ORAL_TABLET | Freq: Once | ORAL | Status: AC
  Administered 2019-07-12: 1 via ORAL
  Filled 2019-07-12: qty 1

## 2019-07-12 MED ORDER — HYDROCODONE-ACETAMINOPHEN 5-325 MG PO TABS: 1.0000 | ORAL_TABLET | Freq: Three times a day (TID) | ORAL | 0 refills | Status: AC | PRN

## 2019-07-12 MED ORDER — LIDOCAINE HCL (PF) 1 % IJ SOLN
5.0000 mL | Freq: Once | INTRAMUSCULAR | Status: AC
  Administered 2019-07-12: 5 mL
  Filled 2019-07-12: qty 5

## 2019-07-12 NOTE — ED Notes (Signed)
EDP in room sewing pt's knee. Pt tolerating well.

## 2019-07-12 NOTE — ED Triage Notes (Signed)
Says gash to left knee this am.

## 2019-07-12 NOTE — ED Provider Notes (Signed)
[facility] Emergency Department Provider Note ____________________________________________  Time seen: 1429  I have reviewed the triage vital signs and the nursing notes.  HISTORY  Chief Complaint  Laceration  HPI Dwayne Lozano. is a 48 y.o. male presents to the ED for evaluation of an accidental laceration to the left knee. He reports his knee being hit by the spinning belt on the lawnmower he was repairing, causing a laceration to the left knee. He is denying any other injury at this time. He is unclear of his tetanus status. He reports pain to the knee and leg with movement.  History reviewed. No pertinent past medical history.  Patient Active Problem List   Diagnosis Date Noted  . Special screening for malignant neoplasms, colon   . Polyp of sigmoid colon   . Rectal polyp   . Abdominal pain, epigastric   . Stomach irritation     Past Surgical History:  Procedure Laterality Date  . CHOLECYSTECTOMY  2005  . COLONOSCOPY WITH PROPOFOL N/A 07/06/2018   Procedure: COLONOSCOPY WITH PROPOFOL;  Surgeon: Virgel Manifold, MD;  Location: [facility] ENDOSCOPY;  Service: Endoscopy;  Laterality: N/A;  . ESOPHAGOGASTRODUODENOSCOPY (EGD) WITH PROPOFOL N/A 07/06/2018   Procedure: ESOPHAGOGASTRODUODENOSCOPY (EGD) WITH PROPOFOL;  Surgeon: Virgel Manifold, MD;  Location: [facility] ENDOSCOPY;  Service: Endoscopy;  Laterality: N/A;    Prior to Admission medications   Medication Sig Start Date End Date Taking? Authorizing Provider  HYDROcodone-acetaminophen (NORCO) 5-325 MG tablet Take 1 tablet by mouth 3 (three) times daily as needed for up to 3 days. 07/12/19 07/15/19  Donette Mainwaring, Dannielle Karvonen, PA-C  sulfamethoxazole-trimethoprim (BACTRIM DS) 800-160 MG tablet Take 1 tablet by mouth 2 (two) times daily. 07/12/19   Kalliope Riesen, Dannielle Karvonen, PA-C    Allergies Patient has no known allergies.  Family History  Problem Relation Age of Onset  . Cancer Mother        cancer in  back?, breast cancer  . Diabetes Maternal Grandmother   . Kidney disease Maternal Grandmother   . Cancer Maternal Aunt        breast cancer, gum cancer    Social History Social History   Tobacco Use  . Smoking status: Current Every Day Smoker  . Smokeless tobacco: Never Used  Substance Use Topics  . Alcohol use: Not Currently  . Drug use: Not Currently    Review of Systems  Constitutional: Negative for fever. Cardiovascular: Negative for chest pain. Respiratory: Negative for shortness of breath. Musculoskeletal: Negative for back pain. Skin: Negative for rash. Left knee laceration.  Neurological: Negative for headaches, focal weakness or numbness. ____________________________________________  PHYSICAL EXAM:  VITAL SIGNS: ED Triage Vitals  Enc Vitals Group     BP 07/12/19 1355 132/77     Pulse Rate 07/12/19 1355 82     Resp 07/12/19 1355 16     Temp 07/12/19 1355 98.1 F (36.7 C)     Temp Source 07/12/19 1355 Oral     SpO2 07/12/19 1355 97 %     Weight 07/12/19 1356 135 lb (61.2 kg)     Height 07/12/19 1356 5' 6.5" (1.689 m)     Head Circumference --      Peak Flow --      Pain Score 07/12/19 1416 5     Pain Loc --      Pain Edu? --      Excl. in [place]? --     Constitutional: Alert and oriented. Well appearing  and in no distress. Head: Normocephalic and atraumatic. Eyes: Conjunctivae are normal. Normal extraocular movements Cardiovascular: Normal rate, regular rhythm. Normal distal pulses. Respiratory: Normal respiratory effort.  Musculoskeletal: left knee without obvious deformity, dislocation, or joint effusion.  Patient with a 3 cm laceration over the medial left knee.  The laceration extends into the subcutaneous tissues.  Patient with a normal knee exam with normal patella tracking without laxity or ballottement.  No internal derangement is suspected.  Nontender with normal range of motion in all extremities.  Neurologic: Antalgic gait without ataxia. Normal  speech and language. No gross focal neurologic deficits are appreciated. Skin:  Skin is warm, dry and intact. No rash noted. ___________________________________________   RADIOLOGY  DG Left Knee IMPRESSION: 1. Laceration inferior to the patella. Injury to the patellar tendon is not excluded on this study but the patella is not high riding suggesting there is not a full-thickness injury. 2. Contour abnormality along the anterior aspect of the patella suggests a tiny fracture from the patient's injury. 3. High attenuation foci in the soft tissues anterior to contour abnormality of the anterior patella may represent tiny bony fragments or foreign bodies. 4. High attenuation foci along the laceration inferior to the patella suggest foreign bodies.  I, Melvenia Needles, personally viewed and evaluated these images (plain radiographs) as part of my medical decision making, as well as reviewing the written report by the radiologist. ____________________________________________  PROCEDURES  Tdap 0.5 ml IM Naproxen 500 mg PO Bactrim DS 1 PO Ultram 50 mg PO Wound dressing Knee immobilizer  .Marland KitchenLaceration Repair  Date/Time: 07/12/2019 2:45 PM Performed by: Norva Karvonen, Student-PA Authorized by: Melvenia Needles, PA-C   Consent:    Consent obtained:  Verbal   Consent given by:  Patient   Risks discussed:  Pain, infection and poor wound healing   Alternatives discussed:  Delayed treatment Anesthesia (see MAR for exact dosages):    Anesthesia method:  Local infiltration   Local anesthetic:  Lidocaine 1% w/o epi Laceration details:    Location:  Leg   Leg location:  L lower leg   Length (cm):  3   Depth (mm):  3 Repair type:    Repair type:  Intermediate Pre-procedure details:    Preparation:  Patient was prepped and draped in usual sterile fashion Treatment:    Area cleansed with:  Betadine and saline   Amount of cleaning:  Standard Subcutaneous repair:     Suture size:  4-0   Suture material:  Vicryl   Suture technique:  Running   Number of sutures:  1 Skin repair:    Repair method:  Sutures   Suture size:  3-0   Suture material:  Nylon   Suture technique:  Simple interrupted   Number of sutures:  5 Approximation:    Approximation:  Close Post-procedure details:    Dressing:  Non-adherent dressing, bulky dressing and splint for protection   Patient tolerance of procedure:  Tolerated well, no immediate complications  ____________________________________________  INITIAL IMPRESSION / ASSESSMENT AND PLAN / ED COURSE  Patient with ED evaluation of acute left knee pain a laceration.  He sustained a contusion to the knee, which resulted in a cortical defect to the patella.  The overlying laceration is repaired using sterile technique and good wound edge approximation is achieved.  He is treated empirically for open fracture and prophylax with Bactrim in the ED.  A prescription for the same was sent to the pharmacy as  well as hydrocodone for acute pain relief.  He is encouraged to keep the wound clean, dry, and covered.  A knee immobilizer is provided for comfort and support.  He will rest, elevate, and ice the knee as needed for pain and inflammation.  He is to return to a local urgent care in 10 to 14 days for suture removal.  He may return to the ED as needed for interim wound check or worsening signs of infection as discussed.  Dwayne Lozano. was evaluated in Emergency Department on 07/12/2019 for the symptoms described in the history of present illness. He was evaluated in the context of the global COVID-19 pandemic, which necessitated consideration that the patient might be at risk for infection with the SARS-CoV-2 virus that causes COVID-19. Institutional protocols and algorithms that pertain to the evaluation of patients at risk for COVID-19 are in a state of rapid change based on information released by regulatory bodies including the CDC and  federal and state organizations. These policies and algorithms were followed during the patient's care in the ED. ____________________________________________  FINAL CLINICAL IMPRESSION(S) / ED DIAGNOSES  Final diagnoses:  Type I or II open sleeve fracture of left patella, initial encounter  Knee laceration, left, initial encounter      Melvenia Needles, PA-C 07/12/19 1743    Delman Kitten, MD 07/13/19 1744

## 2019-07-12 NOTE — Discharge Instructions (Addendum)
You have a small chip fracture to the knee cap from your injury. Because of the laceration over the bony injury, you will be placed on antibiotics. Take the antibiotic as directed and the pain medicine as needed. Keep the wound clean, dry, and covered. Wash only with soap & water. Follow-up with Dr. Sabra Heck for further knee injury. Rest with the knee elevated and apply ice to reduce pain and swelling. You should see a local urgent care or this ED for suture removal in 10-14 days.

## 2020-07-12 IMAGING — DX DG KNEE 1-2V*L*
1 series · 1 of 1 positions shown · non-contrast
Comparison: None

CLINICAL DATA: Lumbar accident.  Laceration and pain.

EXAM:
LEFT KNEE - 1-2 VIEW

[knee ap]
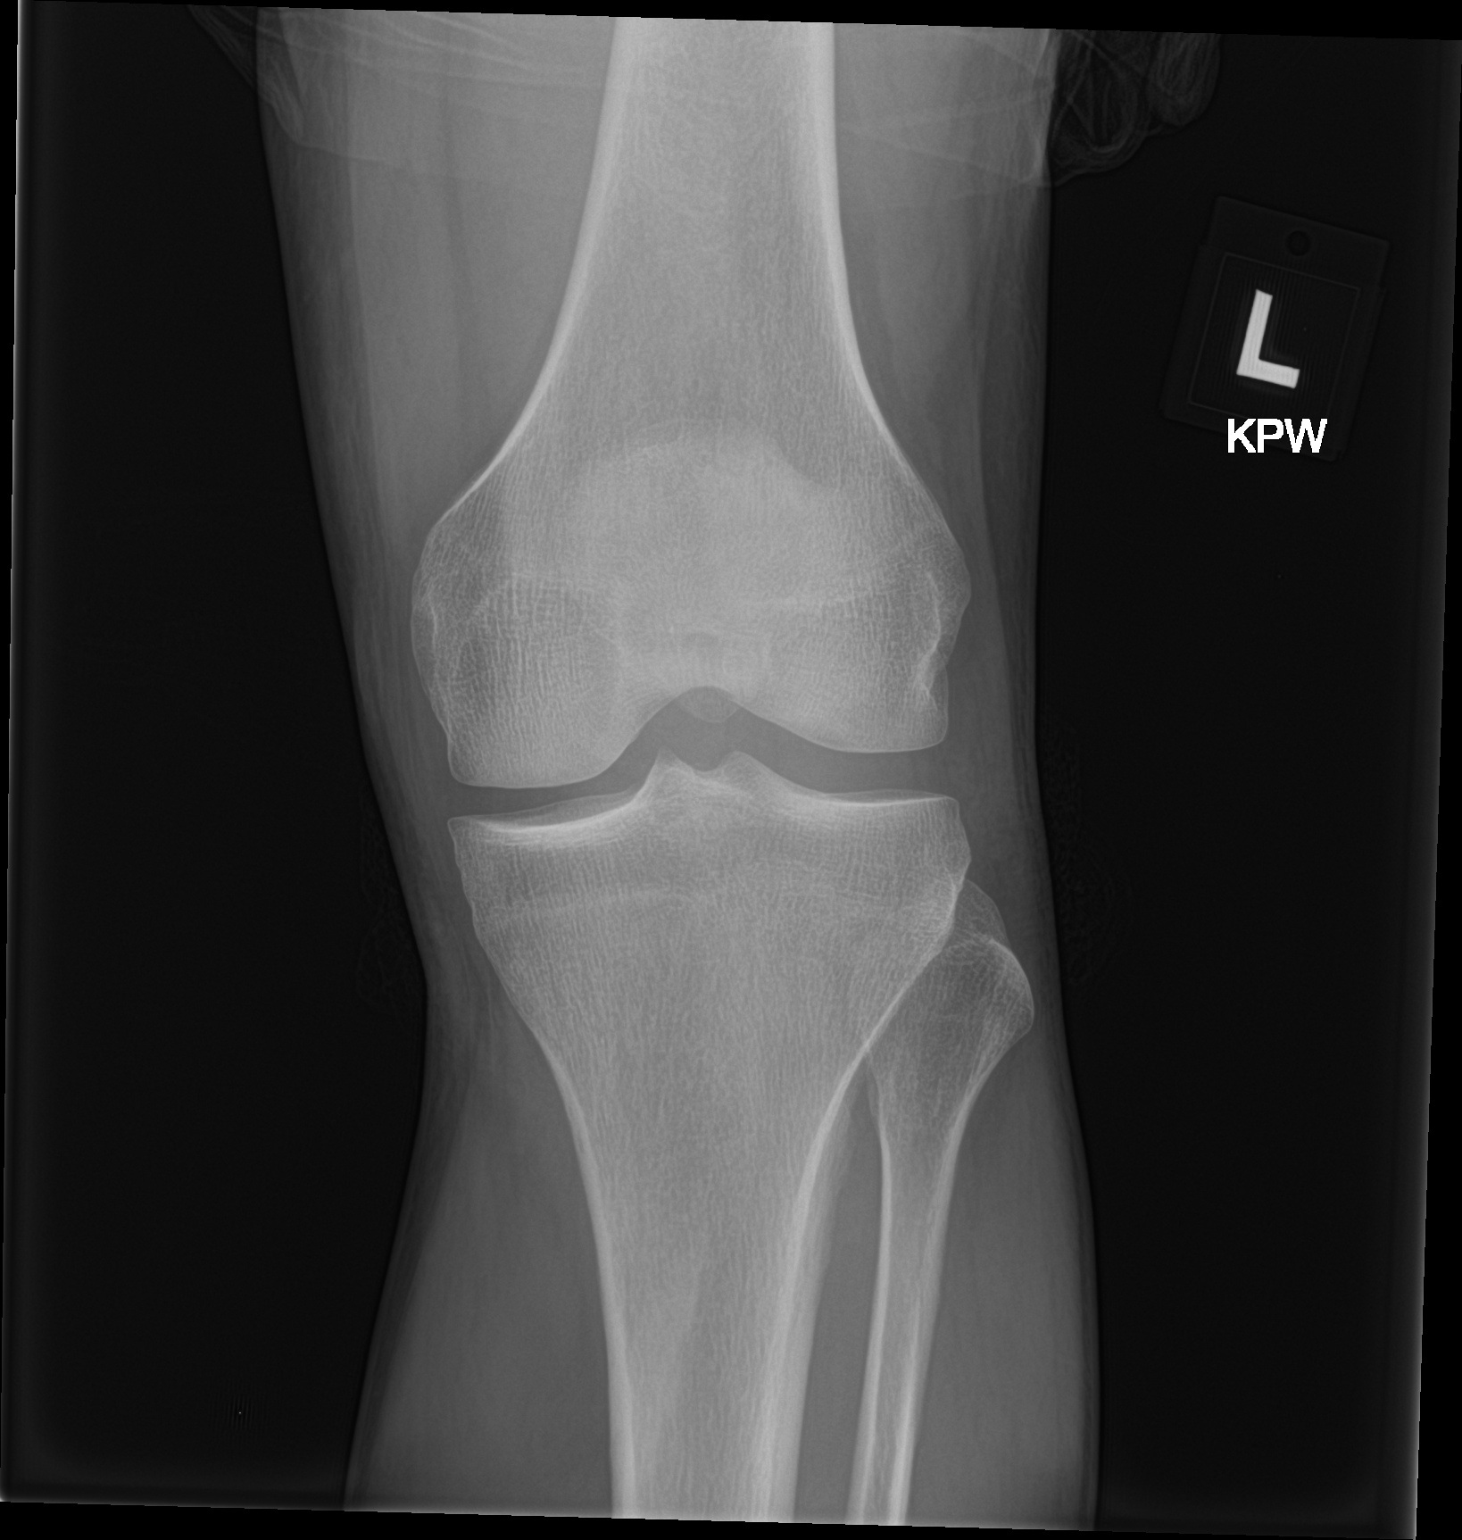

[1 of 1 positions shown; findings below may reference images not displayed]

FINDINGS: There is a laceration anteriorly in the knee just below the patella.
The patella is in appropriate position. There is a tiny contour
abnormality along the anterior aspect patella. Foci of high
attenuation are seen in the soft tissues anterior to this contour
defect and along the course of the laceration inferior to the
patella. No other abnormalities.
IMPRESSION: 1. Laceration inferior to the patella. Injury to the patellar tendon
is not excluded on this study but the patella is not high riding
suggesting there is not a full-thickness injury.
2. Contour abnormality along the anterior aspect of the patella
suggests a tiny fracture from the patient's injury.
3. High attenuation foci in the soft tissues anterior to contour
abnormality of the anterior patella may represent tiny bony
fragments or foreign bodies.
4. High attenuation foci along the laceration inferior to the
patella suggest foreign bodies.

## 2021-06-25 ENCOUNTER — Encounter: Payer: Self-pay | Admitting: Intensive Care

## 2021-06-25 ENCOUNTER — Emergency Department
Admission: EM | Admit: 2021-06-25 | Discharge: 2021-06-25 | Disposition: A | Discharge status: Home / Self Care | Payer: 59 | Attending: Emergency Medicine | Admitting: Emergency Medicine

## 2021-06-25 ENCOUNTER — Emergency Department: Payer: 59

## 2021-06-25 ENCOUNTER — Other Ambulatory Visit: Payer: Self-pay

## 2021-06-25 DIAGNOSIS — J069 Acute upper respiratory infection, unspecified: Secondary | ICD-10-CM | POA: Insufficient documentation

## 2021-06-25 DIAGNOSIS — R059 Cough, unspecified: Secondary | ICD-10-CM | POA: Diagnosis present

## 2021-06-25 DIAGNOSIS — F17210 Nicotine dependence, cigarettes, uncomplicated: Secondary | ICD-10-CM | POA: Diagnosis not present

## 2021-06-25 LAB — COMPREHENSIVE METABOLIC PANEL
ALT: 20 U/L (ref 0–44)
AST: 18 U/L (ref 15–41)
Albumin: 4.6 g/dL (ref 3.5–5.0)
Alkaline Phosphatase: 77 U/L (ref 38–126)
Anion gap: 7 (ref 5–15)
BUN: 13 mg/dL (ref 6–20)
CO2: 26 mmol/L (ref 22–32)
Calcium: 9.5 mg/dL (ref 8.9–10.3)
Chloride: 104 mmol/L (ref 98–111)
Creatinine, Ser: 1.07 mg/dL (ref 0.61–1.24)
GFR, Estimated: >60 mL/min (ref >60)
Glucose, Bld: 111 mg/dL — ABNORMAL HIGH (ref 70–99)
Potassium: 4.3 mmol/L (ref 3.5–5.1)
Sodium: 137 mmol/L (ref 135–145)
Total Bilirubin: 1.1 mg/dL (ref 0.3–1.2)
Total Protein: 7.9 g/dL (ref 6.5–8.1)

## 2021-06-25 LAB — CBC WITH DIFFERENTIAL/PLATELET
Abs Immature Granulocytes: 0.01 10*3/uL (ref 0.00–0.07)
Basophils Absolute: 0.1 10*3/uL (ref 0.0–0.1)
Basophils Relative: 1 %
Eosinophils Absolute: 0.2 10*3/uL (ref 0.0–0.5)
Eosinophils Relative: 3 %
HCT: 44.3 % (ref 39.0–52.0)
Hemoglobin: 15.7 g/dL (ref 13.0–17.0)
Immature Granulocytes: 0 %
Lymphocytes Relative: 37 %
Lymphs Abs: 1.8 10*3/uL (ref 0.7–4.0)
MCH: 32.2 pg (ref 26.0–34.0)
MCHC: 35.4 g/dL (ref 30.0–36.0)
MCV: 90.8 fL (ref 80.0–100.0)
Monocytes Absolute: 0.4 10*3/uL (ref 0.1–1.0)
Monocytes Relative: 8 %
Neutro Abs: 2.4 10*3/uL (ref 1.7–7.7)
Neutrophils Relative %: 51 %
Platelets: 214 10*3/uL (ref 150–400)
RBC: 4.88 MIL/uL (ref 4.22–5.81)
RDW: 13.5 % (ref 11.5–15.5)
WBC: 4.7 10*3/uL (ref 4.0–10.5)
nRBC: 0 % (ref 0.0–0.2)

## 2021-06-25 MED ORDER — AZITHROMYCIN 250 MG PO TABS: ORAL_TABLET | ORAL | 0 refills | Status: AC

## 2021-06-25 NOTE — ED Provider Notes (Signed)
[facility] Emergency Department Provider Note  ____________________________________________   Event Date/Time   First MD Initiated Contact with Patient 06/25/21 1313     (approximate)  I have reviewed the triage vital signs and the nursing notes.   HISTORY  Chief Complaint Cough    HPI Dwayne Lozano. is a 50 y.o. male [facility] emergency department with URI symptoms.  Complains of a cough with blood streaked in the mucus.  Had a fever this morning but none now.  He denies chest pain or shortness of breath.  Has not been around anyone with flu or COVID.  History reviewed. No pertinent past medical history.  Patient Active Problem List   Diagnosis Date Noted   Special screening for malignant neoplasms, colon    Polyp of sigmoid colon    Rectal polyp    Abdominal pain, epigastric    Stomach irritation     Past Surgical History:  Procedure Laterality Date   CHOLECYSTECTOMY  2005   COLONOSCOPY WITH PROPOFOL N/A 07/06/2018   Procedure: COLONOSCOPY WITH PROPOFOL;  Surgeon: Virgel Manifold, MD;  Location: [facility] ENDOSCOPY;  Service: Endoscopy;  Laterality: N/A;   ESOPHAGOGASTRODUODENOSCOPY (EGD) WITH PROPOFOL N/A 07/06/2018   Procedure: ESOPHAGOGASTRODUODENOSCOPY (EGD) WITH PROPOFOL;  Surgeon: Virgel Manifold, MD;  Location: [facility] ENDOSCOPY;  Service: Endoscopy;  Laterality: N/A;    Prior to Admission medications   Medication Sig Start Date End Date Taking? Authorizing Provider  azithromycin (ZITHROMAX Z-PAK) 250 MG tablet 2 pills today then 1 pill a day for 4 days 06/25/21  Yes Kynisha Memon, Linden Dolin, PA-C    Allergies Patient has no known allergies.  Family History  Problem Relation Age of Onset   Cancer Mother        cancer in back?, breast cancer   Diabetes Maternal Grandmother    Kidney disease Maternal Grandmother    Cancer Maternal Aunt        breast cancer, gum cancer    Social History Social History   Tobacco Use   Smoking status:  Every Day    Types: Cigarettes   Smokeless tobacco: Never  Vaping Use   Vaping Use: Some days  Substance Use Topics   Alcohol use: Not Currently   Drug use: Not Currently    Review of Systems  Constitutional:  Positive fever/chills Eyes: No visual changes. ENT: No sore throat. Respiratory: Positive cough Cardiovascular: Denies chest pain Gastrointestinal: Denies abdominal pain Genitourinary: Negative for dysuria. Musculoskeletal: Negative for back pain. Skin: Negative for rash. Psychiatric: no mood changes,     ____________________________________________   PHYSICAL EXAM:  VITAL SIGNS: ED Triage Vitals [06/25/21 1051]  Enc Vitals Group     BP      Pulse      Resp      Temp      Temp src      SpO2      Weight 135 lb (61.2 kg)     Height 5\' 6"  (1.676 m)     Head Circumference      Peak Flow      Pain Score 0     Pain Loc      Pain Edu?      Excl. in [place]?     Constitutional: Alert and oriented. Well appearing and in no acute distress. Eyes: Conjunctivae are normal.  Head: Atraumatic. Nose: No congestion/rhinnorhea. Mouth/Throat: Mucous membranes are moist.   Neck:  supple no lymphadenopathy noted Cardiovascular: Normal rate, regular rhythm. Heart  sounds are normal Respiratory: Normal respiratory effort.  No retractions, lungs c t a  GU: deferred Musculoskeletal: FROM all extremities, warm and well perfused Neurologic:  Normal speech and language.  Skin:  Skin is warm, dry and intact. No rash noted. Psychiatric: Mood and affect are normal. Speech and behavior are normal.  ____________________________________________   LABS (all labs ordered are listed, but only abnormal results are displayed)  Labs Reviewed  COMPREHENSIVE METABOLIC PANEL - Abnormal; Notable for the following components:      Result Value   Glucose, Bld 111 (*)    All other components within normal limits  CBC WITH DIFFERENTIAL/PLATELET    ____________________________________________   ____________________________________________  RADIOLOGY  Chest x-ray  ____________________________________________   PROCEDURES  Procedure(s) performed: No  Procedures    ____________________________________________   INITIAL IMPRESSION / ASSESSMENT AND PLAN / ED COURSE  Pertinent labs & imaging results that were available during my care of the patient were reviewed by me and considered in my medical decision making (see chart for details).   The patient is a 50 year old male presents with fever and cough.  See HPI.  Physical exam shows patient appears stable.  Patient's labs are reassuring, CBC and metabolic panel are normal, chest x-ray reviewed by me confirmed by radiology to be normal  Did explain findings to the patient.  He does appear to be very well.  Placed him on antibiotic and told him to over-the-counter medications.  He does not want to get a COVID test at this time.  He is given a work note and discharged stable condition.     Dwayne Lozano. was evaluated in Emergency Department on 06/25/2021 for the symptoms described in the history of present illness. He was evaluated in the context of the global COVID-19 pandemic, which necessitated consideration that the patient might be at risk for infection with the SARS-CoV-2 virus that causes COVID-19. Institutional protocols and algorithms that pertain to the evaluation of patients at risk for COVID-19 are in a state of rapid change based on information released by regulatory bodies including the CDC and federal and state organizations. These policies and algorithms were followed during the patient's care in the ED.    As part of my medical decision making, I reviewed the following data within the [place] notes reviewed and incorporated, Labs reviewed , Old chart reviewed, Radiograph reviewed , Notes from prior ED visits, and [place] Controlled  Substance Database  ____________________________________________   FINAL CLINICAL IMPRESSION(S) / ED DIAGNOSES  Final diagnoses:  Acute upper respiratory infection      NEW MEDICATIONS STARTED DURING THIS VISIT:  Discharge Medication List as of 06/25/2021  1:27 PM     START taking these medications   Details  azithromycin (ZITHROMAX Z-PAK) 250 MG tablet 2 pills today then 1 pill a day for 4 days, Normal         Note:  This document was prepared using Dragon voice recognition software and may include unintentional dictation errors.    Versie Starks, PA-C 06/25/21 1541    Carrie Mew, MD 06/30/21 2330

## 2021-06-25 NOTE — Discharge Instructions (Signed)
Follow up with your regular doctor if not improving in 3 days Return to the ER if worsening Take the antibiotic as prescribed Tylenol and ibuprofen for fever if needed

## 2021-06-25 NOTE — ED Triage Notes (Signed)
Patient c/o streaks of red in emesis X1 this AM and when blowing nose. Has had cough but cant recall how long. Heavy smoker. C/o mucous.

## 2021-09-02 ENCOUNTER — Emergency Department: Payer: 59

## 2021-09-02 ENCOUNTER — Encounter: Payer: Self-pay | Admitting: Medical Oncology

## 2021-09-02 ENCOUNTER — Emergency Department
Admission: EM | Admit: 2021-09-02 | Discharge: 2021-09-02 | Disposition: A | Discharge status: Home / Self Care | Payer: 59 | Attending: Emergency Medicine | Admitting: Emergency Medicine

## 2021-09-02 DIAGNOSIS — Z20822 Contact with and (suspected) exposure to covid-19: Secondary | ICD-10-CM | POA: Diagnosis not present

## 2021-09-02 DIAGNOSIS — R059 Cough, unspecified: Secondary | ICD-10-CM | POA: Diagnosis present

## 2021-09-02 DIAGNOSIS — J441 Chronic obstructive pulmonary disease with (acute) exacerbation: Secondary | ICD-10-CM | POA: Insufficient documentation

## 2021-09-02 DIAGNOSIS — F17200 Nicotine dependence, unspecified, uncomplicated: Secondary | ICD-10-CM | POA: Insufficient documentation

## 2021-09-02 LAB — CBC
HCT: 45.3 % (ref 39.0–52.0)
Hemoglobin: 15.4 g/dL (ref 13.0–17.0)
MCH: 31.6 pg (ref 26.0–34.0)
MCHC: 34 g/dL (ref 30.0–36.0)
MCV: 92.8 fL (ref 80.0–100.0)
Platelets: 225 10*3/uL (ref 150–400)
RBC: 4.88 MIL/uL (ref 4.22–5.81)
RDW: 13.7 % (ref 11.5–15.5)
WBC: 6.4 10*3/uL (ref 4.0–10.5)
nRBC: 0 % (ref 0.0–0.2)

## 2021-09-02 LAB — BASIC METABOLIC PANEL
Anion gap: 7 (ref 5–15)
BUN: 12 mg/dL (ref 6–20)
CO2: 24 mmol/L (ref 22–32)
Calcium: 8.7 mg/dL — ABNORMAL LOW (ref 8.9–10.3)
Chloride: 104 mmol/L (ref 98–111)
Creatinine, Ser: 1.08 mg/dL (ref 0.61–1.24)
GFR, Estimated: >60 mL/min (ref >60)
Glucose, Bld: 105 mg/dL — ABNORMAL HIGH (ref 70–99)
Potassium: 4.1 mmol/L (ref 3.5–5.1)
Sodium: 135 mmol/L (ref 135–145)

## 2021-09-02 LAB — RESP PANEL BY RT-PCR (FLU A&B, COVID) ARPGX2
Influenza A by PCR: NEGATIVE
Influenza B by PCR: NEGATIVE
SARS Coronavirus 2 by RT PCR: NEGATIVE

## 2021-09-02 LAB — TROPONIN I (HIGH SENSITIVITY)
Troponin I (High Sensitivity): 3 ng/L (ref <18)
Troponin I (High Sensitivity): <2 ng/L (ref <18)

## 2021-09-02 MED ORDER — ALBUTEROL SULFATE HFA 108 (90 BASE) MCG/ACT IN AERS: 2.0000 | INHALATION_SPRAY | Freq: Four times a day (QID) | RESPIRATORY_TRACT | 2 refills | Status: AC | PRN

## 2021-09-02 MED ORDER — DOXYCYCLINE HYCLATE 100 MG PO TABS
100.0000 mg | ORAL_TABLET | Freq: Once | ORAL | Status: AC
  Administered 2021-09-02: 100 mg via ORAL
  Filled 2021-09-02: qty 1

## 2021-09-02 MED ORDER — DOXYCYCLINE HYCLATE 100 MG PO TABS: 100.0000 mg | ORAL_TABLET | Freq: Two times a day (BID) | ORAL | 0 refills | Status: AC

## 2021-09-02 MED ORDER — PREDNISONE 50 MG PO TABS: 50.0000 mg | ORAL_TABLET | Freq: Every day | ORAL | 0 refills | Status: AC

## 2021-09-02 MED ORDER — IPRATROPIUM-ALBUTEROL 0.5-2.5 (3) MG/3ML IN SOLN
6.0000 mL | Freq: Once | RESPIRATORY_TRACT | Status: AC
  Administered 2021-09-02: 6 mL via RESPIRATORY_TRACT
  Filled 2021-09-02: qty 3

## 2021-09-02 MED ORDER — PREDNISONE 20 MG PO TABS
60.0000 mg | ORAL_TABLET | Freq: Once | ORAL | Status: AC
  Administered 2021-09-02: 60 mg via ORAL
  Filled 2021-09-02: qty 3

## 2021-09-02 NOTE — ED Triage Notes (Signed)
Pt reports that he has been having central chest pressure with productive cough that began yesterday. Denies fever.

## 2021-09-02 NOTE — ED Notes (Signed)
Pt called for repeat trop and vitals and no answer

## 2021-09-02 NOTE — ED Provider Notes (Signed)
[facility] Provider Note    Event Date/Time   First MD Initiated Contact with Patient 09/02/21 1637     (approximate)   History   Chest Pain and Cough   HPI  Dwayne Lozano. is a 51 y.o. male who presents to the ED for evaluation of Chest Pain and Cough  I review outpatient GI note from 06/13/2018.  55-pack-year smoker history.  No formal diagnosis of COPD.  He has had inhalers in the past.  Patient presents to the ED for evaluation of about 2 days of increased cough, increased sputum production and associated chest discomfort.  He reports occasional chills without fevers or documented fevers.  Denies exertional chest pain, but reports pain to his bilateral flanks with coughing.  Denies any chest pain right now.  Reports he continues to smoke and does not have an inhaler at home any longer.  Reports increased sputum production, and some pink/bloody streaking in his sputum earlier today.  Due to this of bloody streaking, he presents to the ED for evaluation.  Physical Exam   Triage Vital Signs: ED Triage Vitals  Enc Vitals Group     BP 09/02/21 1331 128/85     Pulse Rate 09/02/21 1331 70     Resp 09/02/21 1331 16     Temp 09/02/21 1331 98.2 F (36.8 C)     Temp Source 09/02/21 1331 Oral     SpO2 09/02/21 1331 96 %     Weight 09/02/21 1328 135 lb (61.2 kg)     Height 09/02/21 1328 5\' 6"  (1.676 m)     Head Circumference --      Peak Flow --      Pain Score 09/02/21 1328 4     Pain Loc --      Pain Edu? --      Excl. in [place]? --     Most recent vital signs: Vitals:   09/02/21 1331 09/02/21 1641  BP: 128/85 129/78  Pulse: 70 67  Resp: 16 16  Temp: 98.2 F (36.8 C)   SpO2: 96% 96%    General: Awake, no distress. CV:  Good peripheral perfusion.  Resp:  Normal effort.  Prolonged expiratory phase, decreased breath sounds and scattered expiratory wheezes. Abd:  No distention.  MSK:  No deformity noted.  No peripheral edema. Neuro:  No focal  deficits appreciated. Other:     ED Results / Procedures / Treatments   Labs (all labs ordered are listed, but only abnormal results are displayed) Labs Reviewed  BASIC METABOLIC PANEL - Abnormal; Notable for the following components:      Result Value   Glucose, Bld 105 (*)    Calcium 8.7 (*)    All other components within normal limits  RESP PANEL BY RT-PCR (FLU A&B, COVID) ARPGX2  CBC  TROPONIN I (HIGH SENSITIVITY)  TROPONIN I (HIGH SENSITIVITY)    EKG  Sinus rhythm, rate of 66 bpm.  Normal axis and intervals.  No evidence of acute ischemia.  RADIOLOGY CXR reviewed by me without evidence of acute cardiopulmonary pathology.  Official radiology report(s): DG Chest 2 View  Result Date: 09/02/2021 CLINICAL DATA:  Chest pain EXAM: CHEST - 2 VIEW COMPARISON:  Chest radiograph 06/25/2021 FINDINGS: The cardiomediastinal silhouette is normal. There is no focal consolidation or pulmonary edema. There is no pleural effusion or pneumothorax. There is no acute osseous abnormality. IMPRESSION: No radiographic evidence of acute cardiopulmonary process. Electronically Signed   By: Collier Salina  Noone M.D.   On: 09/02/2021 14:00    PROCEDURES and INTERVENTIONS:  Procedures  Medications  ipratropium-albuterol (DUONEB) 0.5-2.5 (3) MG/3ML nebulizer solution 6 mL (6 mLs Nebulization Given 09/02/21 1840)  doxycycline (VIBRA-TABS) tablet 100 mg (100 mg Oral Given 09/02/21 1839)  predniSONE (DELTASONE) tablet 60 mg (60 mg Oral Given 09/02/21 1840)     IMPRESSION / MDM / ASSESSMENT AND PLAN / ED COURSE  I reviewed the triage vital signs and the nursing notes.  51 year old male presents to the ED with cough, chest pain and hemoptysis with evidence of COPD exacerbation suitable for outpatient management.  His vitals are normal on room air and he looks clinically well.  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  He has prolonged expiratory phase and wheezing that becomes more  pronounced after breathing treatments.  No distress, indications for BiPAP or admission.  CXR without signs of infiltrate or PTX, but he does have increased sputum production that would benefit from a few days of antibiotics.  Blood work is benign without evidence of leukocytosis, electrolyte derangements.  No signs of ACS and he looks better after some breathing treatments.  We will discharge with an inhaler, steroid burst and antibiotics.  Refer to pulmonology.  We discussed return precautions.  Clinical Course as of 09/02/21 1907  Wed Sep 02, 2021  1906 Reassessed.  Feeling better after the breathing treatments.  Improved airflow.  We discussed management at home and return precautions.  We discussed reaching out to pulmonology. [DS]    Clinical Course User Index [DS] Vladimir Crofts, MD     FINAL CLINICAL IMPRESSION(S) / ED DIAGNOSES   Final diagnoses:  COPD exacerbation ([place])     Rx / DC Orders   ED Discharge Orders          Ordered    albuterol (VENTOLIN HFA) 108 (90 Base) MCG/ACT inhaler  Every 6 hours PRN        09/02/21 1813    doxycycline (VIBRA-TABS) 100 MG tablet  2 times daily        09/02/21 1904    predniSONE (DELTASONE) 50 MG tablet  Daily        09/02/21 1904             Note:  This document was prepared using Dragon voice recognition software and may include unintentional dictation errors.   Vladimir Crofts, MD 09/02/21 Einar Crow

## 2021-09-02 NOTE — Discharge Instructions (Signed)
You are being discharged with 3 prescriptions: Albuterol inhaler to use as needed every 4-6 hours for wheezing, coughing and feeling short of breath. Doxycycline antibiotic to take twice daily for the next 5 days to treat any infection. Prednisone steroids to take once daily starting tomorrow, we gave you steroids already for today.  Please reach out to the pulmonology clinic to discuss getting testing for COPD

## 2022-02-03 ENCOUNTER — Encounter: Payer: Self-pay | Admitting: Medical Oncology

## 2022-02-03 ENCOUNTER — Emergency Department: Payer: Self-pay

## 2022-02-03 ENCOUNTER — Emergency Department
Admission: EM | Admit: 2022-02-03 | Discharge: 2022-02-03 | Disposition: A | Discharge status: Home / Self Care | Payer: Self-pay | Attending: Emergency Medicine | Admitting: Emergency Medicine

## 2022-02-03 DIAGNOSIS — R051 Acute cough: Secondary | ICD-10-CM

## 2022-02-03 DIAGNOSIS — J42 Unspecified chronic bronchitis: Secondary | ICD-10-CM | POA: Insufficient documentation

## 2022-02-03 DIAGNOSIS — J029 Acute pharyngitis, unspecified: Secondary | ICD-10-CM | POA: Insufficient documentation

## 2022-02-03 MED ORDER — GUAIFENESIN ER 600 MG PO TB12: 600.0000 mg | ORAL_TABLET | Freq: Two times a day (BID) | ORAL | 0 refills | Status: AC

## 2022-02-03 MED ORDER — IPRATROPIUM-ALBUTEROL 0.5-2.5 (3) MG/3ML IN SOLN
3.0000 mL | Freq: Once | RESPIRATORY_TRACT | Status: AC
  Administered 2022-02-03: 3 mL via RESPIRATORY_TRACT
  Filled 2022-02-03: qty 3

## 2022-02-03 MED ORDER — DOXYCYCLINE HYCLATE 100 MG PO CAPS: 100.0000 mg | ORAL_CAPSULE | Freq: Two times a day (BID) | ORAL | 0 refills | Status: AC

## 2022-02-03 NOTE — ED Triage Notes (Signed)
Pt reports that he has been having sore throat, cough, congestion and off and on fever x 1 week.

## 2022-02-03 NOTE — ED Notes (Signed)
Patient transported to X-ray 

## 2022-02-03 NOTE — ED Provider Notes (Signed)
[facility] Provider Note    Event Date/Time   First MD Initiated Contact with Patient 02/03/22 0830     (approximate)   History   Chief Complaint URI   HPI  Dwayne Cahall. is a 51 y.o. male with past medical history of recurrent bronchitis who presents to the ED complaining of cough.  Patient reports that he has been dealing with about 5 days of cough productive of thick whitish sputum.  He denies any associated fevers or chest pain, does state he has begun to feel short of breath at times.  He denies any shortness of breath currently and he has not noticed any pain or swelling in his legs.  He additionally complains of sore throat, congestion, malaise, and chills.  He states his brother was sick with similar symptoms last week.  He has been using an albuterol inhaler at home with minimal relief, states he has dealt with recurrent bronchitis in the past, typically multiple times per year.     Physical Exam   Triage Vital Signs: ED Triage Vitals  Enc Vitals Group     BP 02/03/22 0738 113/90     Pulse Rate 02/03/22 0738 100     Resp 02/03/22 0738 18     Temp 02/03/22 0738 98.1 F (36.7 C)     Temp Source 02/03/22 0738 Oral     SpO2 02/03/22 0738 95 %     Weight 02/03/22 0737 135 lb (61.2 kg)     Height 02/03/22 0737 '5\' 6"'$  (1.676 m)     Head Circumference --      Peak Flow --      Pain Score 02/03/22 0737 4     Pain Loc --      Pain Edu? --      Excl. in [place]? --     Most recent vital signs: Vitals:   02/03/22 0738  BP: 113/90  Pulse: 100  Resp: 18  Temp: 98.1 F (36.7 C)  SpO2: 95%    Constitutional: Alert and oriented. Eyes: Conjunctivae are normal. Head: Atraumatic. Nose: No congestion/rhinnorhea. Mouth/Throat: Mucous membranes are moist.  Posterior oropharynx without erythema, edema, or exudates. Cardiovascular: Normal rate, regular rhythm. Grossly normal heart sounds.  2+ radial pulses bilaterally. Respiratory: Normal respiratory  effort.  No retractions. Lungs CTAB. Gastrointestinal: Soft and nontender. No distention. Musculoskeletal: No lower extremity tenderness nor edema.  Neurologic:  Normal speech and language. No gross focal neurologic deficits are appreciated.    ED Results / Procedures / Treatments   Labs (all labs ordered are listed, but only abnormal results are displayed) Labs Reviewed - No data to display  RADIOLOGY Chest x-ray reviewed reviewed and interpreted by me with no infiltrate, edema, or effusion.  PROCEDURES:  Critical Care performed: No  Procedures   MEDICATIONS ORDERED IN ED: Medications  ipratropium-albuterol (DUONEB) 0.5-2.5 (3) MG/3ML nebulizer solution 3 mL (3 mLs Nebulization Given 02/03/22 0905)     IMPRESSION / MDM / ASSESSMENT AND PLAN / ED COURSE  I reviewed the triage vital signs and the nursing notes.                              51 y.o. male with past medical history of recurrent bronchitis who presents to the ED with productive cough for the past 5 days associated with sore throat and malaise.  Patient's presentation is most consistent with acute complicated illness /  injury requiring diagnostic workup.  Differential diagnosis includes, but is not limited to, COPD exacerbation, acute bronchitis, pneumonia, strep pharyngitis, viral pharyngitis.  Patient well-appearing and in no acute distress, vital signs are unremarkable and he is breathing comfortably on room air.  Lungs are clear to auscultation bilaterally, however patient does have long history of recurrent bronchitis with likely undiagnosed COPD.  Chest x-ray is unremarkable but given his purulent sputum production, patient would benefit from a course of antibiotics.  He was given a DuoNeb here in the ED with improvement in symptoms, states he has an inhaler available at home that he will continue to use.  No findings to suggest strep pharyngitis at this time.  He is appropriate for outpatient management with  albuterol, antibiotics, and Mucinex.  He was counseled to return to the ED for new or worsening symptoms, otherwise establish care with PCP.  Patient agrees with plan.      FINAL CLINICAL IMPRESSION(S) / ED DIAGNOSES   Final diagnoses:  Acute cough  Chronic bronchitis, unspecified chronic bronchitis type ([place])     Rx / DC Orders   ED Discharge Orders          Ordered    guaiFENesin (MUCINEX) 600 MG 12 hr tablet  2 times daily        02/03/22 1013    doxycycline (VIBRAMYCIN) 100 MG capsule  2 times daily        02/03/22 1013             Note:  This document was prepared using Dragon voice recognition software and may include unintentional dictation errors.   Blake Divine, MD 02/03/22 1018

## 2022-02-23 ENCOUNTER — Telehealth: Payer: Self-pay

## 2022-02-23 NOTE — Telephone Encounter (Signed)
LVM for ED referral to [facility] about establishing care

## 2022-05-20 ENCOUNTER — Emergency Department: Payer: Self-pay

## 2022-05-20 ENCOUNTER — Emergency Department
Admission: EM | Admit: 2022-05-20 | Discharge: 2022-05-20 | Disposition: A | Discharge status: Home / Self Care | Payer: Self-pay | Attending: Emergency Medicine | Admitting: Emergency Medicine

## 2022-05-20 ENCOUNTER — Other Ambulatory Visit: Payer: Self-pay

## 2022-05-20 DIAGNOSIS — R059 Cough, unspecified: Secondary | ICD-10-CM | POA: Insufficient documentation

## 2022-05-20 DIAGNOSIS — R109 Unspecified abdominal pain: Secondary | ICD-10-CM

## 2022-05-20 DIAGNOSIS — R1032 Left lower quadrant pain: Secondary | ICD-10-CM | POA: Insufficient documentation

## 2022-05-20 DIAGNOSIS — R0981 Nasal congestion: Secondary | ICD-10-CM | POA: Insufficient documentation

## 2022-05-20 DIAGNOSIS — Z20822 Contact with and (suspected) exposure to covid-19: Secondary | ICD-10-CM | POA: Insufficient documentation

## 2022-05-20 LAB — HEPATIC FUNCTION PANEL
ALT: 15 U/L (ref 0–44)
AST: 17 U/L (ref 15–41)
Albumin: 4.3 g/dL (ref 3.5–5.0)
Alkaline Phosphatase: 103 U/L (ref 38–126)
Bilirubin, Direct: <0.1 mg/dL (ref 0.0–0.2)
Total Bilirubin: 0.7 mg/dL (ref 0.3–1.2)
Total Protein: 7.5 g/dL (ref 6.5–8.1)

## 2022-05-20 LAB — URINALYSIS, ROUTINE W REFLEX MICROSCOPIC
Bacteria, UA: NONE SEEN
Bilirubin Urine: NEGATIVE
Glucose, UA: NEGATIVE mg/dL
Hgb urine dipstick: NEGATIVE
Ketones, ur: NEGATIVE mg/dL
Leukocytes,Ua: NEGATIVE
Nitrite: NEGATIVE
Protein, ur: NEGATIVE mg/dL
Specific Gravity, Urine: <1.005 — ABNORMAL LOW (ref 1.005–1.030)
pH: 5.5 (ref 5.0–8.0)

## 2022-05-20 LAB — LIPASE, BLOOD: Lipase: 25 U/L (ref 11–51)

## 2022-05-20 LAB — CBC
HCT: 45 % (ref 39.0–52.0)
Hemoglobin: 15 g/dL (ref 13.0–17.0)
MCH: 30.9 pg (ref 26.0–34.0)
MCHC: 33.3 g/dL (ref 30.0–36.0)
MCV: 92.6 fL (ref 80.0–100.0)
Platelets: 221 10*3/uL (ref 150–400)
RBC: 4.86 MIL/uL (ref 4.22–5.81)
RDW: 13.4 % (ref 11.5–15.5)
WBC: 5.1 10*3/uL (ref 4.0–10.5)
nRBC: 0 % (ref 0.0–0.2)

## 2022-05-20 LAB — BASIC METABOLIC PANEL
Anion gap: 6 (ref 5–15)
BUN: 10 mg/dL (ref 6–20)
CO2: 24 mmol/L (ref 22–32)
Calcium: 9.3 mg/dL (ref 8.9–10.3)
Chloride: 108 mmol/L (ref 98–111)
Creatinine, Ser: 1.14 mg/dL (ref 0.61–1.24)
GFR, Estimated: >60 mL/min (ref >60)
Glucose, Bld: 109 mg/dL — ABNORMAL HIGH (ref 70–99)
Potassium: 4.3 mmol/L (ref 3.5–5.1)
Sodium: 138 mmol/L (ref 135–145)

## 2022-05-20 LAB — SARS CORONAVIRUS 2 BY RT PCR: SARS Coronavirus 2 by RT PCR: NEGATIVE

## 2022-05-20 MED ORDER — BENZONATATE 100 MG PO CAPS: 100.0000 mg | ORAL_CAPSULE | Freq: Three times a day (TID) | ORAL | 0 refills | Status: AC | PRN

## 2022-05-20 MED ORDER — IOHEXOL 300 MG/ML  SOLN
100.0000 mL | Freq: Once | INTRAMUSCULAR | Status: AC | PRN
  Administered 2022-05-20: 100 mL via INTRAVENOUS

## 2022-05-20 NOTE — Discharge Instructions (Addendum)
Your work-up was reassuring with negative CT imaging but you can return to the ER if develop fevers or worsening symptoms or any other concerns

## 2022-05-20 NOTE — ED Provider Notes (Signed)
[facility] Provider Note    Event Date/Time   First MD Initiated Contact with Patient 05/20/22 1118     (approximate)   History   Flank Pain   HPI  Dwayne Lozano. is a 51 y.o. male who comes in with concern for flank pain.  To me patient reports more left lower quadrant pain not really flank pain.  He reports its been there for a few days.  He does not have any history of kidney stone.  He denies having this previously.  Denies any pain in his testicles but did state that was difficult to get his urine out initially.  He also reports having some sinus congestion in his work telling him to come into the emergency room to make sure that he was okay so that he can get cleared for going back to work.  He reports taking some over-the-counter Mucinex.  He reports a little bit of coughing but denies any chest pain or shortness of breath.  Physical Exam   Triage Vital Signs: ED Triage Vitals  Enc Vitals Group     BP 05/20/22 0907 133/73     Pulse Rate 05/20/22 0907 68     Resp 05/20/22 0907 17     Temp 05/20/22 0906 97.8 F (36.6 C)     Temp Source 05/20/22 0906 Oral     SpO2 05/20/22 0907 98 %     Weight 05/20/22 0906 130 lb (59 kg)     Height 05/20/22 0906 '5\' 6"'$  (1.676 m)     Head Circumference --      Peak Flow --      Pain Score 05/20/22 0904 5     Pain Loc --      Pain Edu? --      Excl. in [place]? --     Most recent vital signs: Vitals:   05/20/22 0906 05/20/22 0907  BP:  133/73  Pulse:  68  Resp:  17  Temp: 97.8 F (36.6 C)   SpO2:  98%     General: Awake, no distress.  CV:  Good peripheral perfusion.  Resp:  Normal effort.  Abd:  No distention.  Tender in the left lower quadrant Other:     ED Results / Procedures / Treatments   Labs (all labs ordered are listed, but only abnormal results are displayed) Labs Reviewed  URINALYSIS, ROUTINE W REFLEX MICROSCOPIC - Abnormal; Notable for the following components:      Result Value    Color, Urine YELLOW (*)    Specific Gravity, Urine <1.005 (*)    All other components within normal limits  BASIC METABOLIC PANEL - Abnormal; Notable for the following components:   Glucose, Bld 109 (*)    All other components within normal limits  CBC      RADIOLOGY I have reviewed the xray personally and interpreted no evidence of any pneumonia   PROCEDURES:  Critical Care performed: No  Procedures   MEDICATIONS ORDERED IN ED: Medications - No data to display   IMPRESSION / MDM / [place] / ED COURSE  I reviewed the triage vital signs and the nursing notes.   Patient's presentation is most consistent with acute presentation with potential threat to life or bodily function.   Patient comes in with left lower quadrant pain will get CT imaging to make sure no evidence of diverticulitis, abscess, obstruction.  He also has some concern for cough and congestion.  We  will get COVID test and chest x-ray to evaluate for any pneumonia.  CBC normal.  Urine without evidence of UTI.  BMP reassuring  COVID test was negative.  CT imaging was reassuring.  Reevaluated patient and updated him on results.  The symptoms and only going on for a few days so no indication for antibiotics.  Suspect this is more likely viral illness.  Offered patient STD testing but he declined stating no new contacts and no discharge.  He denies any pain in his testicles.  We will get postvoid bladder scan- patient will return to the emergency room if he develops worsening symptoms or any other concerns     FINAL CLINICAL IMPRESSION(S) / ED DIAGNOSES   Final diagnoses:  Sinus congestion  Abdominal pain, unspecified abdominal location     Rx / DC Orders   ED Discharge Orders     None        Note:  This document was prepared using Dragon voice recognition software and may include unintentional dictation errors.   Vanessa Lynden, MD 05/20/22 [phone]

## 2022-05-20 NOTE — ED Triage Notes (Addendum)
Pt comes with c/o flank pain and possible kidney stone. Pt states on left side. 5/10 pain at this time.

## 2022-06-26 IMAGING — CR DG CHEST 1V
1 series · 1 of 1 positions shown · non-contrast
Comparison: 06/13/2017

CLINICAL DATA: Cough

EXAM:
CHEST  1 VIEW

[dg chest 1 view]
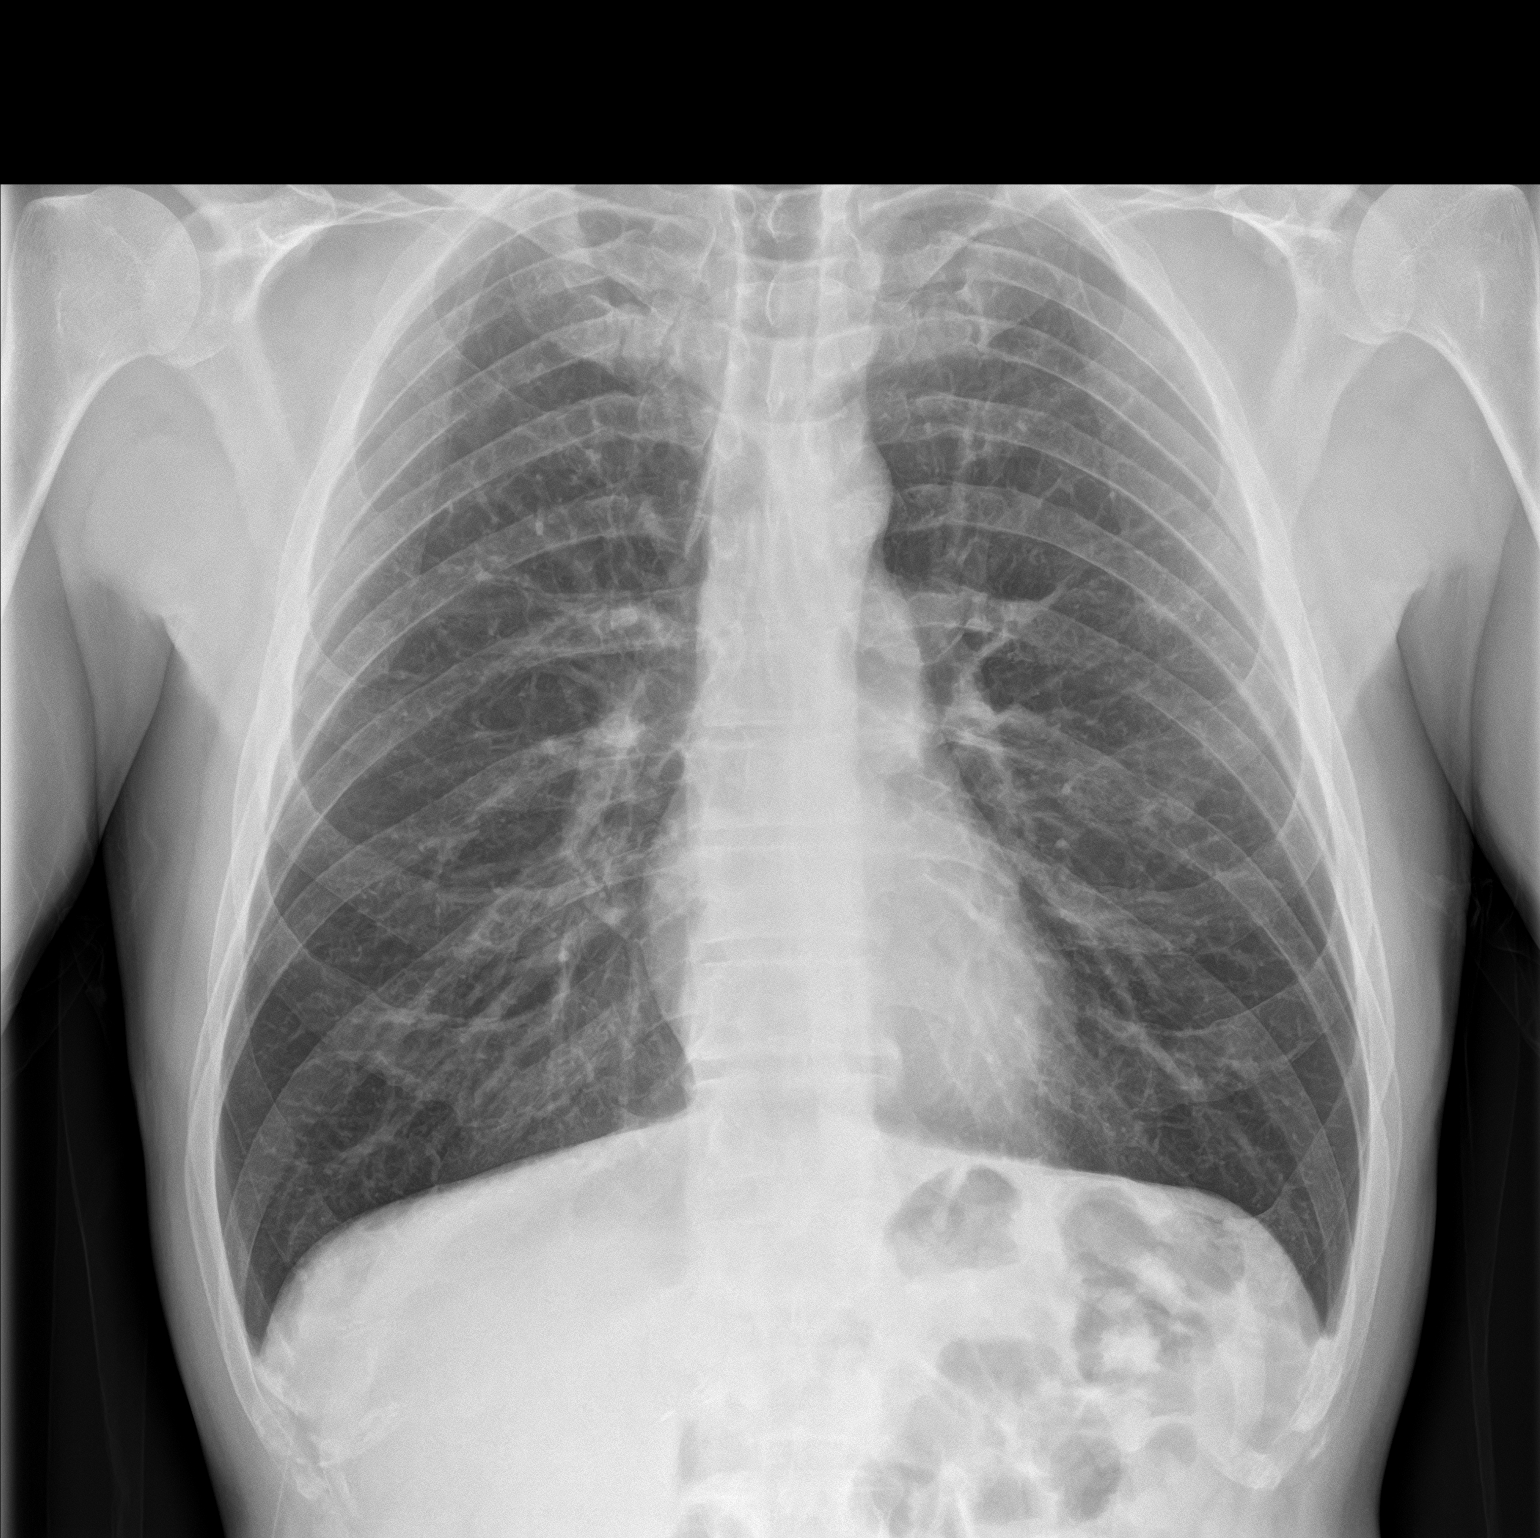

[1 of 1 positions shown; findings below may reference images not displayed]

FINDINGS: The heart size and mediastinal contours are within normal limits.
Both lungs are clear. The visualized skeletal structures are
unremarkable.
IMPRESSION: No acute abnormality of the lungs in frontal projection.

## 2022-09-03 IMAGING — CR DG CHEST 2V
1 series · 2 of 2 positions shown · non-contrast
Comparison: Chest radiograph 06/25/2021

CLINICAL DATA: Chest pain

EXAM:
CHEST - 2 VIEW

[Series 1: dg chest 2 view · 0.14mm/px · 2 of 2 slices shown]
[im 1/2]
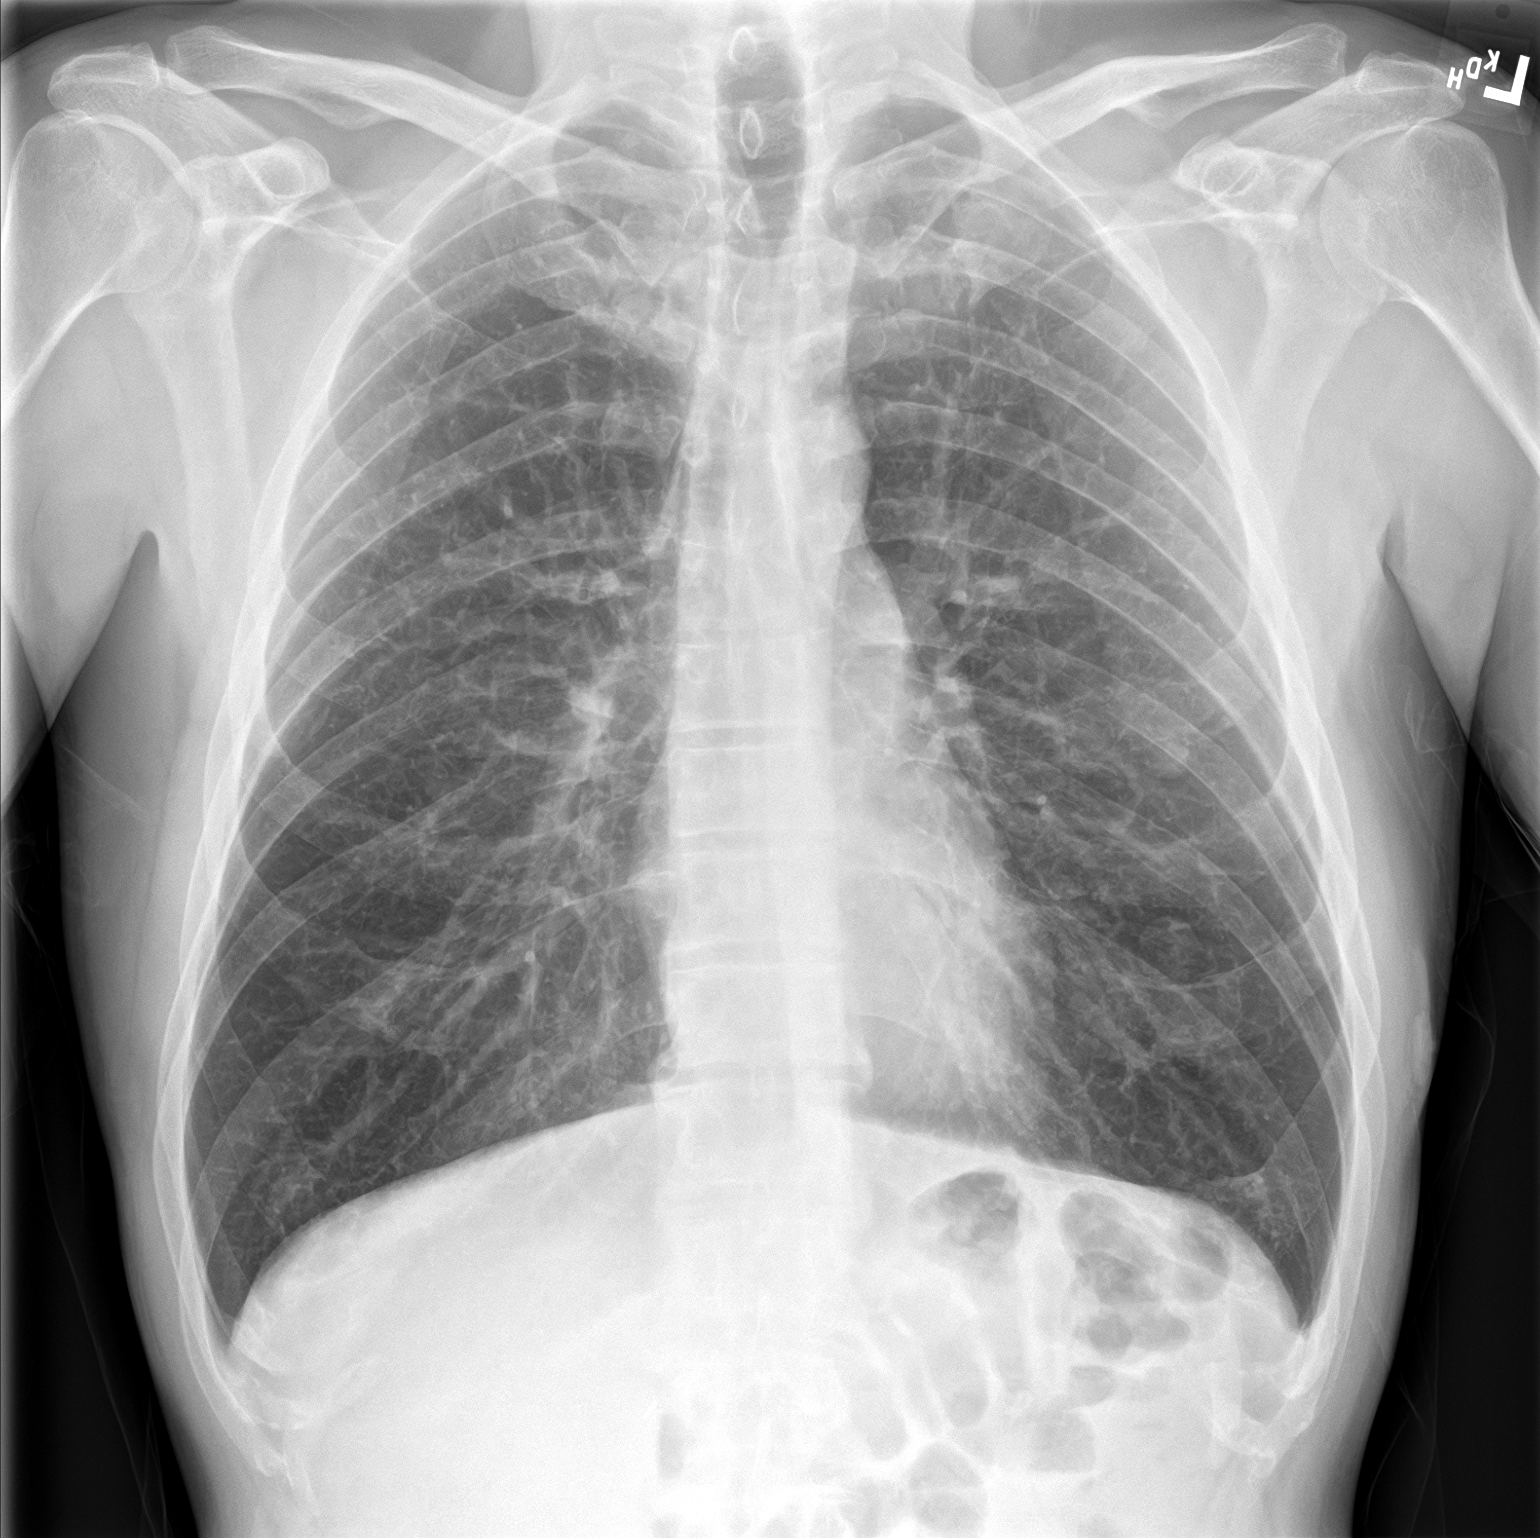
[im 2/2]
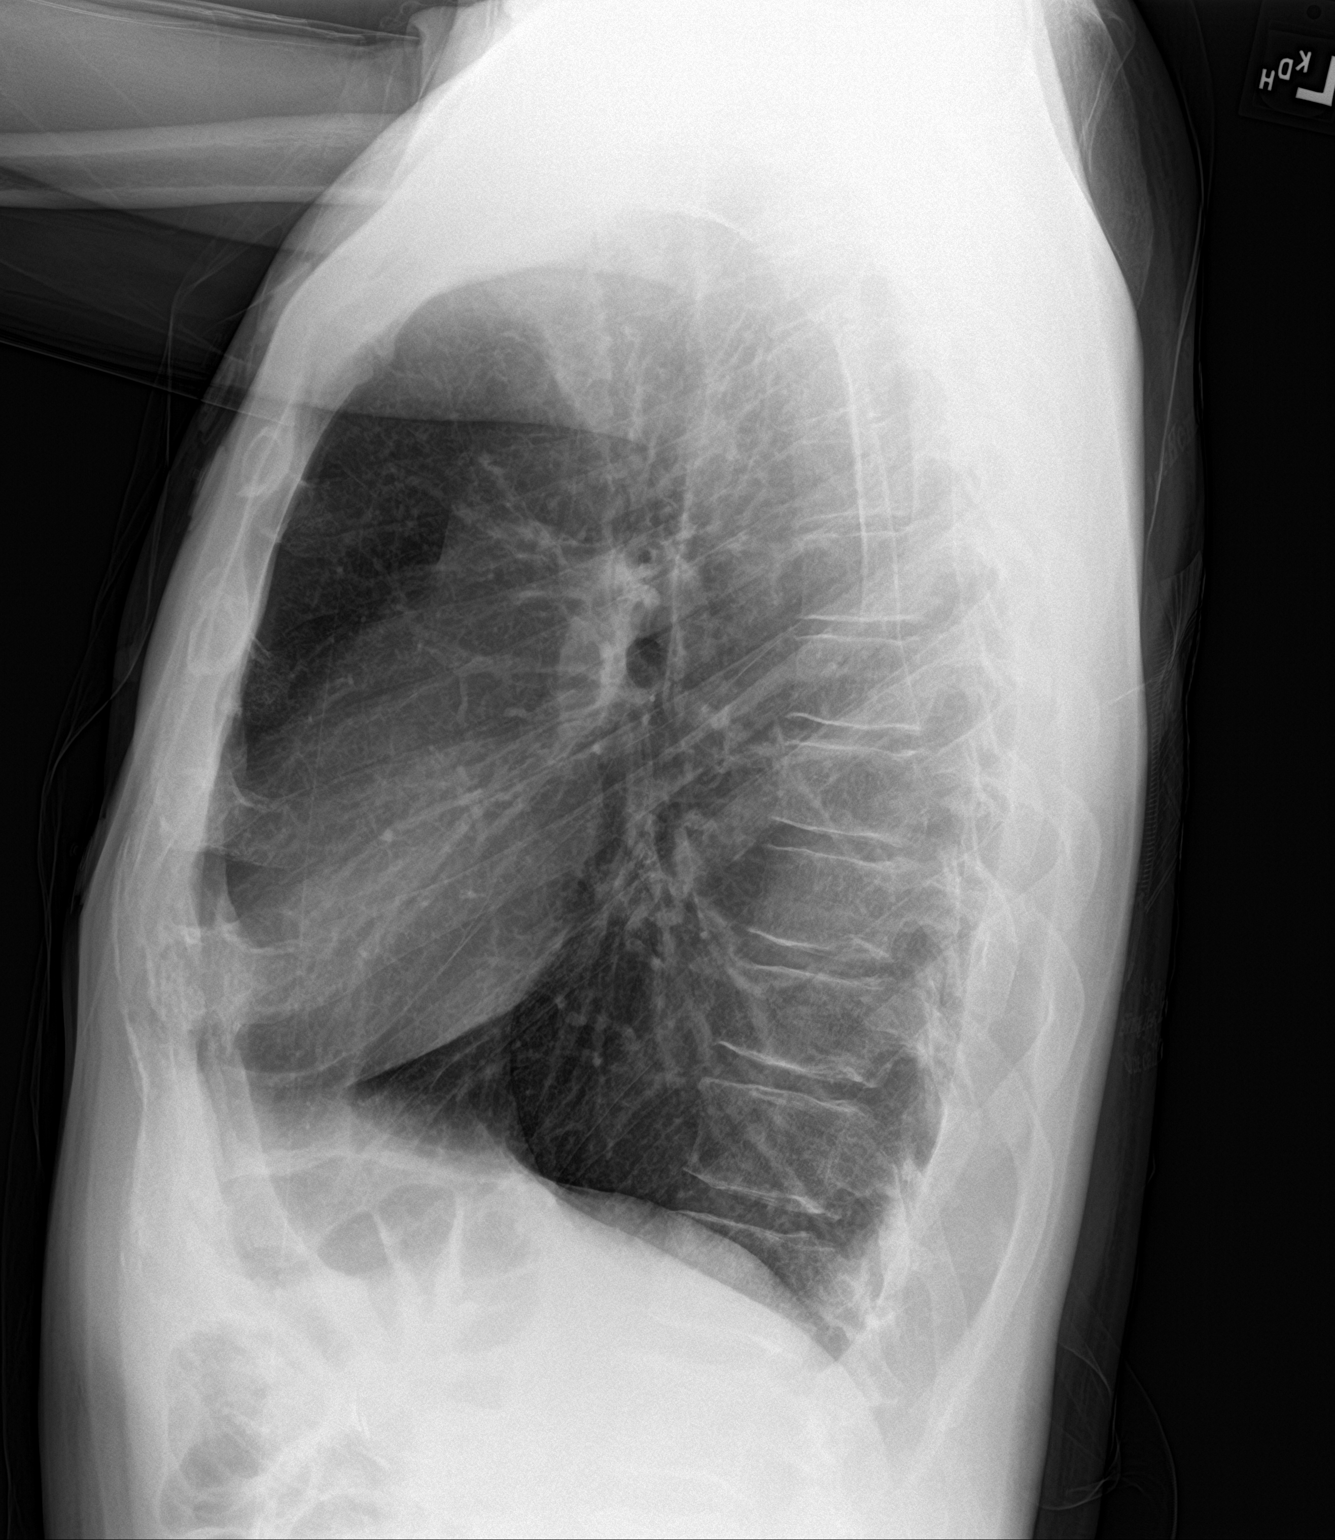

[2 of 2 positions shown; findings below may reference images not displayed]

FINDINGS: The cardiomediastinal silhouette is normal.

There is no focal consolidation or pulmonary edema. There is no
pleural effusion or pneumothorax.

There is no acute osseous abnormality.
IMPRESSION: No radiographic evidence of acute cardiopulmonary process.

## 2023-02-04 IMAGING — CR DG CHEST 2V
1 series · 2 of 2 positions shown · non-contrast
Comparison: Chest radiograph 09/02/2021

CLINICAL DATA: Cough.

EXAM:
CHEST - 2 VIEW

[Series 1: dg chest 2 view · 0.14mm/px · 2 of 2 slices shown]
[im 1/2]
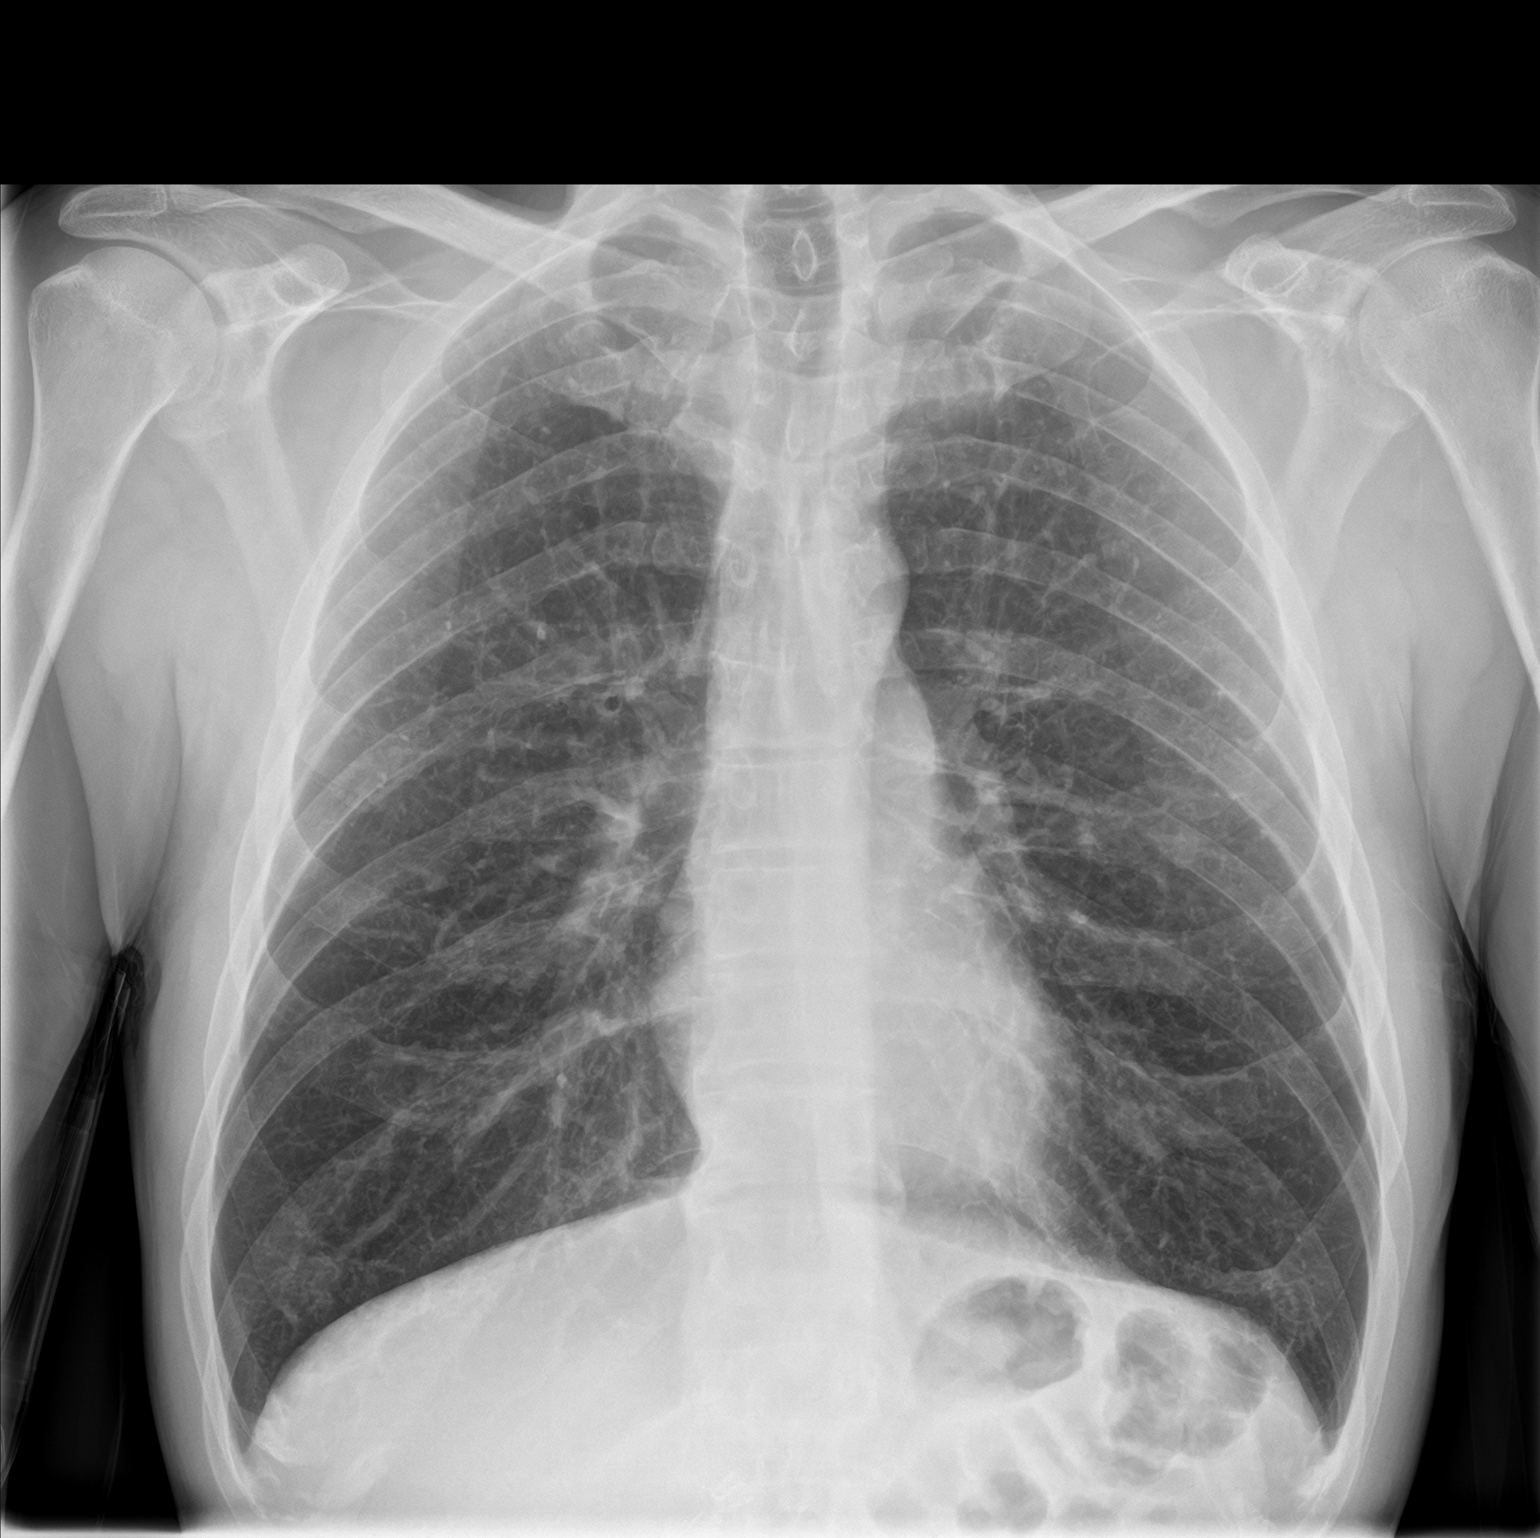
[im 2/2]
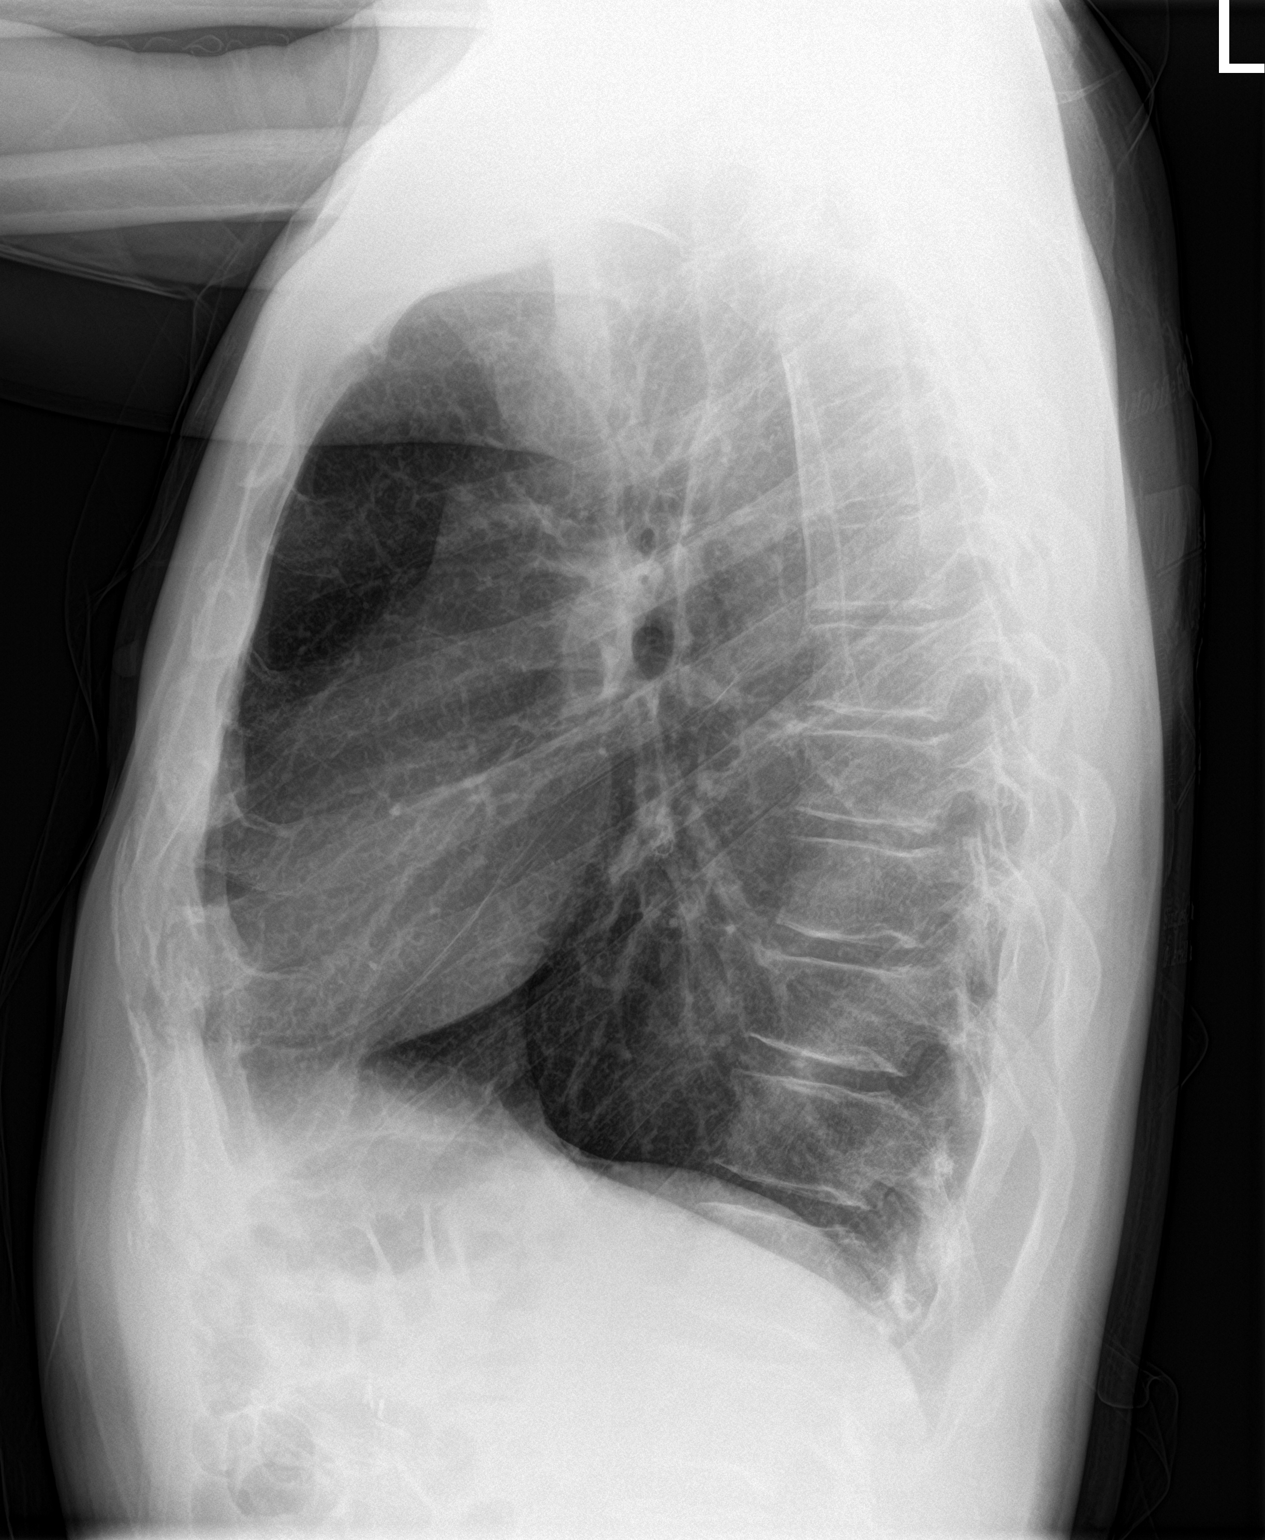

[2 of 2 positions shown; findings below may reference images not displayed]

FINDINGS: Stable nodular density in the left mid chest that most likely
represents a nipple shadow. There is probably a small nipple shadow
on the right side as well. Otherwise, the lungs are clear but there
may be underlying mild hyperinflation. Heart and mediastinum are
within normal limits. Trachea is midline. No acute bone abnormality.
IMPRESSION: No active cardiopulmonary disease.

## 2023-09-16 ENCOUNTER — Emergency Department
Admission: EM | Admit: 2023-09-16 | Discharge: 2023-09-16 | Disposition: A | Discharge status: Home / Self Care | Payer: No Typology Code available for payment source | Attending: Emergency Medicine | Admitting: Emergency Medicine

## 2023-09-16 ENCOUNTER — Other Ambulatory Visit: Payer: Self-pay

## 2023-09-16 ENCOUNTER — Emergency Department: Payer: No Typology Code available for payment source

## 2023-09-16 DIAGNOSIS — R1032 Left lower quadrant pain: Secondary | ICD-10-CM | POA: Diagnosis not present

## 2023-09-16 DIAGNOSIS — R140 Abdominal distension (gaseous): Secondary | ICD-10-CM | POA: Diagnosis not present

## 2023-09-16 DIAGNOSIS — R1031 Right lower quadrant pain: Secondary | ICD-10-CM | POA: Insufficient documentation

## 2023-09-16 DIAGNOSIS — R109 Unspecified abdominal pain: Secondary | ICD-10-CM

## 2023-09-16 LAB — COMPREHENSIVE METABOLIC PANEL
ALT: 16 U/L (ref 0–44)
AST: 17 U/L (ref 15–41)
Albumin: 4.4 g/dL (ref 3.5–5.0)
Alkaline Phosphatase: 79 U/L (ref 38–126)
Anion gap: 11 (ref 5–15)
BUN: 17 mg/dL (ref 6–20)
CO2: 20 mmol/L — ABNORMAL LOW (ref 22–32)
Calcium: 8.9 mg/dL (ref 8.9–10.3)
Chloride: 107 mmol/L (ref 98–111)
Creatinine, Ser: 1.06 mg/dL (ref 0.61–1.24)
GFR, Estimated: >60 mL/min (ref >60)
Glucose, Bld: 114 mg/dL — ABNORMAL HIGH (ref 70–99)
Potassium: 4.1 mmol/L (ref 3.5–5.1)
Sodium: 138 mmol/L (ref 135–145)
Total Bilirubin: 0.5 mg/dL (ref 0.0–1.2)
Total Protein: 7 g/dL (ref 6.5–8.1)

## 2023-09-16 LAB — CBC
HCT: 42.9 % (ref 39.0–52.0)
Hemoglobin: 14.5 g/dL (ref 13.0–17.0)
MCH: 31.2 pg (ref 26.0–34.0)
MCHC: 33.8 g/dL (ref 30.0–36.0)
MCV: 92.3 fL (ref 80.0–100.0)
Platelets: 231 10*3/uL (ref 150–400)
RBC: 4.65 MIL/uL (ref 4.22–5.81)
RDW: 13.4 % (ref 11.5–15.5)
WBC: 4.1 10*3/uL (ref 4.0–10.5)
nRBC: 0 % (ref 0.0–0.2)

## 2023-09-16 LAB — URINALYSIS, ROUTINE W REFLEX MICROSCOPIC
Bilirubin Urine: NEGATIVE
Glucose, UA: NEGATIVE mg/dL
Hgb urine dipstick: NEGATIVE
Ketones, ur: NEGATIVE mg/dL
Leukocytes,Ua: NEGATIVE
Nitrite: NEGATIVE
Protein, ur: NEGATIVE mg/dL
Specific Gravity, Urine: 1.014 (ref 1.005–1.030)
pH: 5 (ref 5.0–8.0)

## 2023-09-16 LAB — LIPASE, BLOOD: Lipase: 25 U/L (ref 11–51)

## 2023-09-16 MED ORDER — IOHEXOL 300 MG/ML  SOLN
80.0000 mL | Freq: Once | INTRAMUSCULAR | Status: AC | PRN
  Administered 2023-09-16: 80 mL via INTRAVENOUS

## 2023-09-16 NOTE — ED Triage Notes (Signed)
Bilat lower abd pain for a week, denies issues with bowel movements, and denies any areas of swelling or hx of hernia, states that he was seen a year ago for similar issues and the ct didn't show anything, denies issues with voiding

## 2023-09-16 NOTE — ED Provider Triage Note (Signed)
Emergency Medicine Provider Triage Evaluation Note  Dwayne Lozano. , a 53 y.o. male  was evaluated in triage.  Pt complains of bilateral abdominal pain for about a week, no vomiting or diarrhea, no dysuria.  Review of Systems  Positive:  Negative:   Physical Exam  BP 126/76 (BP Location: Left Arm)   Pulse 64   Temp 97.9 F (36.6 C) (Oral)   Resp 16   Ht 5\' 6"  (1.676 m)   Wt 63 kg   SpO2 97%   BMI 22.44 kg/m  Gen:   Awake, no distress   Resp:  Normal effort  MSK:   Moves extremities without difficulty  Other:    Medical Decision Making  Medically screening exam initiated at 10:16 AM.  Appropriate orders placed.  Cherlyn Cushing. was informed that the remainder of the evaluation will be completed by another provider, this initial triage assessment does not replace that evaluation, and the importance of remaining in the ED until their evaluation is complete.     Faythe Ghee, PA-C 09/16/23 1017

## 2023-09-16 NOTE — ED Provider Notes (Signed)
[facility] Provider Note    Event Date/Time   First MD Initiated Contact with Patient 09/16/23 1219     (approximate)   History   Abdominal Pain   HPI  Dwayne Groenewold. is a 53 y.o. male with history of sigmoid colon polyp and rectal polyp presents emergency department with abdominal bloating and pain.  Patient states symptoms for about 1 week.  Mostly in the left lower quadrant but some in the right.  No burning with urination.  States it is not a sharp pain he just feels more bloated and pressure.  No difficulty with bowel movement      Physical Exam   Triage Vital Signs: ED Triage Vitals  Encounter Vitals Group     BP 09/16/23 1007 126/76     Systolic BP Percentile --      Diastolic BP Percentile --      Pulse Rate 09/16/23 1007 64     Resp 09/16/23 1007 16     Temp 09/16/23 1007 97.9 F (36.6 C)     Temp Source 09/16/23 1007 Oral     SpO2 09/16/23 1007 97 %     Weight 09/16/23 1008 139 lb (63 kg)     Height 09/16/23 1008 5\' 6"  (1.676 m)     Head Circumference --      Peak Flow --      Pain Score 09/16/23 1008 2     Pain Loc --      Pain Education --      Exclude from Growth Chart --     Most recent vital signs: Vitals:   09/16/23 1007 09/16/23 1403  BP: 126/76 119/75  Pulse: 64 (!) 58  Resp: 16 18  Temp: 97.9 F (36.6 C)   SpO2: 97% 97%     General: Awake, no distress.   CV:  Good peripheral perfusion. regular rate and  rhythm Resp:  Normal effort. Lungs cta Abd:  No distention.  Tender in the left lower quadrant Other:      ED Results / Procedures / Treatments   Labs (all labs ordered are listed, but only abnormal results are displayed) Labs Reviewed  COMPREHENSIVE METABOLIC PANEL - Abnormal; Notable for the following components:      Result Value   CO2 20 (*)    Glucose, Bld 114 (*)    All other components within normal limits  URINALYSIS, ROUTINE W REFLEX MICROSCOPIC - Abnormal; Notable for the following  components:   Color, Urine YELLOW (*)    APPearance CLEAR (*)    All other components within normal limits  LIPASE, BLOOD  CBC     EKG     RADIOLOGY CT renal stone    PROCEDURES:   Procedures   MEDICATIONS ORDERED IN ED: Medications  iohexol (OMNIPAQUE) 300 MG/ML solution 80 mL (80 mLs Intravenous Contrast Given 09/16/23 1349)     IMPRESSION / MDM / ASSESSMENT AND PLAN / ED COURSE  I reviewed the triage vital signs and the nursing notes.                              Differential diagnosis includes, but is not limited to, diverticulosis, diverticulitis, kidney stone, appendicitis, colon cancer  Patient's presentation is most consistent with acute illness / injury with system symptoms.   Patient's labs are reassuring, will do CT abdomen pelvis IV contrast   Care transferred to [facility]  Dwayne Fries, PA-C   FINAL CLINICAL IMPRESSION(S) / ED DIAGNOSES   Final diagnoses:  Abdominal pain, unspecified abdominal location     Rx / DC Orders   ED Discharge Orders     None        Note:  This document was prepared using Dragon voice recognition software and may include unintentional dictation errors.    Dwayne Ghee, PA-C 09/16/23 1517    Jene Every, MD 09/16/23 [phone]

## 2023-09-16 NOTE — Discharge Instructions (Signed)
Your labs and imaging were reassuring today. Please schedule a follow up appointment with Dr. Timothy Lasso, information attached to have a colonoscopy and endoscopy. Take tylenol as needed for pain.

## 2023-09-16 NOTE — ED Provider Notes (Signed)
----------------------------------------- 4:14 PM on 09/16/2023 -----------------------------------------  Blood pressure 119/75, pulse (!) 58, temperature 97.9 F (36.6 C), temperature source Oral, resp. rate 18, height 5\' 6"  (1.676 m), weight 63 kg, SpO2 97%.  Assuming care from Greig Right, PA-C.  In short, Dwayne Lozano. is a 53 y.o. male with a chief complaint of Abdominal Pain .  Refer to the original H&P for additional details.  The current plan of care is to wait for CT results and discharge if negative.  ____________________________________________    ED Results / Procedures / Treatments   Labs (all labs ordered are listed, but only abnormal results are displayed) Labs Reviewed  COMPREHENSIVE METABOLIC PANEL - Abnormal; Notable for the following components:      Result Value   CO2 20 (*)    Glucose, Bld 114 (*)    All other components within normal limits  URINALYSIS, ROUTINE W REFLEX MICROSCOPIC - Abnormal; Notable for the following components:   Color, Urine YELLOW (*)    APPearance CLEAR (*)    All other components within normal limits  LIPASE, BLOOD  CBC     RADIOLOGY  I personally viewed and evaluated these images as part of my medical decision making, as well as reviewing the written report by the radiologist.  ED Provider Interpretation: Negative for any acute abnormalities.   CT ABDOMEN PELVIS W CONTRAST Result Date: 09/16/2023 CLINICAL DATA:  Abdominal pain, acute, nonlocalized. EXAM: CT ABDOMEN AND PELVIS WITH CONTRAST TECHNIQUE: Multidetector CT imaging of the abdomen and pelvis was performed using the standard protocol following bolus administration of intravenous contrast. RADIATION DOSE REDUCTION: This exam was performed according to the departmental dose-optimization program which includes automated exposure control, adjustment of the mA and/or kV according to patient size and/or use of iterative reconstruction technique. CONTRAST:  80mL OMNIPAQUE  IOHEXOL 300 MG/ML  SOLN COMPARISON:  CT scan abdomen and pelvis from 05/20/2022. FINDINGS: Lower chest: The lung bases are clear. No pleural effusion. The heart is normal in size. No pericardial effusion. Hepatobiliary: The liver is normal in size. Non-cirrhotic configuration. No suspicious mass. These is mild diffuse hepatic steatosis. No intrahepatic or extrahepatic bile duct dilation. Gallbladder is surgically absent. Pancreas: Unremarkable. No pancreatic ductal dilatation or surrounding inflammatory changes. Spleen: Within normal limits. No focal lesion. Adrenals/Urinary Tract: Adrenal glands are unremarkable. No suspicious renal mass. No hydronephrosis. No renal or ureteric calculi. Unremarkable urinary bladder. Stomach/Bowel: No disproportionate dilation of the small or large bowel loops. No evidence of abnormal bowel wall thickening or inflammatory changes. The appendix is unremarkable. Vascular/Lymphatic: No ascites or pneumoperitoneum. No abdominal or pelvic lymphadenopathy, by size criteria. No aneurysmal dilation of the major abdominal arteries. There are mild peripheral atherosclerotic vascular calcifications of the aorta and its major branches. Reproductive: Normal size prostate. Symmetric seminal vesicles. Other: There is a tiny fat containing umbilical hernia. The soft tissues and abdominal wall are otherwise unremarkable. Musculoskeletal: No suspicious osseous lesions. IMPRESSION: 1. No acute inflammatory process identified within the abdomen or pelvis. No bowel obstruction. 2. No nephroureterolithiasis or obstructive uropathy. 3. Multiple other nonacute observations, as described above. Electronically Signed   By: Jules Schick M.D.   On: 09/16/2023 15:21     PROCEDURES:  Critical Care performed: No  Procedures   MEDICATIONS ORDERED IN ED: Medications  iohexol (OMNIPAQUE) 300 MG/ML solution 80 mL (80 mLs Intravenous Contrast Given 09/16/23 1349)     IMPRESSION / MDM / ASSESSMENT AND  PLAN / ED COURSE  I reviewed the  triage vital signs and the nursing notes.                             53 year old male presents for evaluation of lower abdominal pain.  Vital signs stable and patient NAD.  Differential diagnosis includes, but is not limited to, Differential diagnosis includes, but is not limited to, acute appendicitis, renal colic, testicular torsion, urinary tract infection/pyelonephritis, prostatitis,  epididymitis, diverticulitis, small bowel obstruction or ileus, colitis, abdominal aortic aneurysm, gastroenteritis, hernia, etc.  Patient's presentation is most consistent with acute complicated illness / injury requiring diagnostic workup.  Labs reassuring.  Urinalysis without signs of infection.  CT scan was negative for any acute abnormalities.  Patient reports that he has a history of polyps and is supposed to have a colonoscopy once a year and endoscopy every 6 months.  He has been without insurance until recently so he is overdue for these procedures.  I will give him follow-up information for GI so he can get this scheduled. Recommended tylenol use for pain and avoiding NSAIDs.  Patient's diagnosis is consistent with lower abdominal pain. Patient is to follow up with GI as needed or otherwise directed. Patient is given ED precautions to return to the ED for any worsening or new symptoms.     FINAL CLINICAL IMPRESSION(S) / ED DIAGNOSES   Final diagnoses:  Abdominal pain, unspecified abdominal location     Rx / DC Orders   ED Discharge Orders     None        Note:  This document was prepared using Dragon voice recognition software and may include unintentional dictation errors.    Cameron Ali, PA-C 09/16/23 1643    Corena Herter, MD 09/16/23 [phone]
